# Patient Record
Sex: Male | Born: 1975 | Race: White | Hispanic: No | Smoking: Current every day smoker
Health system: Southern US, Community
[De-identification: ages and names within clinical notes are randomized; demographics above are authoritative.]

## PROBLEM LIST (undated history)

## (undated) DIAGNOSIS — E785 Hyperlipidemia, unspecified: Secondary | ICD-10-CM

## (undated) DIAGNOSIS — S069XAA Unspecified intracranial injury with loss of consciousness status unknown, initial encounter: Secondary | ICD-10-CM

## (undated) DIAGNOSIS — T7840XA Allergy, unspecified, initial encounter: Secondary | ICD-10-CM

## (undated) DIAGNOSIS — M109 Gout, unspecified: Secondary | ICD-10-CM

## (undated) DIAGNOSIS — S069X9A Unspecified intracranial injury with loss of consciousness of unspecified duration, initial encounter: Secondary | ICD-10-CM

## (undated) DIAGNOSIS — M1990 Unspecified osteoarthritis, unspecified site: Secondary | ICD-10-CM

## (undated) DIAGNOSIS — I10 Essential (primary) hypertension: Secondary | ICD-10-CM

## (undated) DIAGNOSIS — R569 Unspecified convulsions: Secondary | ICD-10-CM

## (undated) HISTORY — DX: Gout, unspecified: M10.9

## (undated) HISTORY — DX: Allergy, unspecified, initial encounter: T78.40XA

## (undated) HISTORY — DX: Unspecified osteoarthritis, unspecified site: M19.90

## (undated) HISTORY — DX: Unspecified convulsions: R56.9

## (undated) HISTORY — DX: Hyperlipidemia, unspecified: E78.5

## (undated) HISTORY — DX: Essential (primary) hypertension: I10

---

## 1996-03-06 HISTORY — PX: OTHER SURGICAL HISTORY: SHX169

## 2010-01-07 ENCOUNTER — Emergency Department: Payer: Self-pay | Admitting: Emergency Medicine

## 2011-01-01 ENCOUNTER — Emergency Department: Payer: Self-pay | Admitting: *Deleted

## 2011-03-04 ENCOUNTER — Emergency Department: Payer: Self-pay | Admitting: Emergency Medicine

## 2013-01-20 DIAGNOSIS — S61409A Unspecified open wound of unspecified hand, initial encounter: Secondary | ICD-10-CM | POA: Insufficient documentation

## 2013-08-27 ENCOUNTER — Emergency Department: Payer: Self-pay | Admitting: Emergency Medicine

## 2015-06-28 ENCOUNTER — Ambulatory Visit (INDEPENDENT_AMBULATORY_CARE_PROVIDER_SITE_OTHER): Payer: Self-pay | Admitting: Family Medicine

## 2015-06-28 ENCOUNTER — Encounter: Payer: Self-pay | Admitting: Family Medicine

## 2015-06-28 VITALS — BP 131/83 | HR 69 | Temp 98.2°F | Resp 16 | Ht 72.0 in | Wt 201.4 lb

## 2015-06-28 DIAGNOSIS — M1A0610 Idiopathic chronic gout, right knee, without tophus (tophi): Secondary | ICD-10-CM

## 2015-06-28 DIAGNOSIS — Z720 Tobacco use: Secondary | ICD-10-CM | POA: Insufficient documentation

## 2015-06-28 DIAGNOSIS — Z0000 Encounter for general adult medical examination without abnormal findings: Secondary | ICD-10-CM

## 2015-06-28 DIAGNOSIS — E781 Pure hyperglyceridemia: Secondary | ICD-10-CM

## 2015-06-28 DIAGNOSIS — M109 Gout, unspecified: Secondary | ICD-10-CM | POA: Insufficient documentation

## 2015-06-28 MED ORDER — INDOMETHACIN 50 MG PO CAPS: 50.0000 mg | ORAL_CAPSULE | Freq: Three times a day (TID) | ORAL | Status: DC

## 2015-06-28 NOTE — Progress Notes (Signed)
Subjective:    Patient ID: Chad Brady, male    DOB: 08-30-1975, 40 y.o.   MRN: [id number]  HPI: Chad Brady is a 40 y.o. male presenting on 06/28/2015 for Establish Care   HPI  Pt presents to establish care today and for physical. Previous care provider was NONE.  It has been  years since His last PCP visit. Records from previous provider will be requested and reviewed. Current medical problems include:  Gout: First flare was age 68. Due to alcohol. Last gout flare was last week. Has been on allopurinol in the past. + gout at [facility]. Flares when he doesn't water. Joints: L knee, R wrist HTN: While in prison but is diet controlled.  High cholesterol: Told he had in past, high triglycerides. Take fish oil daily.   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  Prostate cancer: never been tested. No family history.  Incarcerated 08-10- had 2 negative PPDs since prison.  Declines HIV and hepatitis testing. Had negative in past.     Past Medical History  Diagnosis Date  . Allergy   . Arthritis   . Hypertension   . Hyperlipidemia   . Seizure (HCC)   . Gout    Social History   Social History  . Marital Status: Single    Spouse Name: N/A  . Number of Children: N/A  . Years of Education: N/A   Occupational History  . Not on file.   Social History Main Topics  . Smoking status: Current Every Day Smoker -- 1.00 packs/day  . Smokeless tobacco: Not on file  . Alcohol Use: No  . Drug Use: No  . Sexual Activity: Not on file   Other Topics Concern  . Not on file   Social History Narrative  . No narrative on file   Family History  Problem Relation Age of Onset  . Alcohol abuse Father   . Heart disease Father   . Stroke Father   . Diabetes Maternal Grandmother   . Diabetes Maternal Grandfather   . Heart disease Maternal Grandfather    No current outpatient prescriptions on file prior to visit.   No current facility-administered medications  on file prior to visit.    Review of Systems  Constitutional: Negative for fever and chills.  HENT: Negative.   Respiratory: Negative for chest tightness, shortness of breath and wheezing.   Cardiovascular: Negative for chest pain, palpitations and leg swelling.  Gastrointestinal: Negative for nausea, vomiting and abdominal pain.  Endocrine: Negative.   Genitourinary: Negative for dysuria, urgency, discharge, penile pain and testicular pain.  Musculoskeletal: Negative for back pain, joint swelling and arthralgias.  Skin: Negative.   Neurological: Negative for dizziness, weakness, numbness and headaches.  Psychiatric/Behavioral: Negative for sleep disturbance and dysphoric mood.   Per HPI unless specifically indicated above     Objective:    BP 131/83 mmHg  Pulse 69  Temp(Src) 98.2 F (36.8 C) (Oral)  Resp 16  Ht 6' (1.829 m)  Wt 201 lb 6.4 oz (91.354 kg)  BMI 27.31 kg/m2  Wt Readings from Last 3 Encounters:  06/28/15 201 lb 6.4 oz (91.354 kg)    Physical Exam  Constitutional: He is oriented to person, place, and time. He appears well-developed and well-nourished. No distress.  HENT:  Head: Normocephalic and atraumatic.  Neck: Neck supple. No thyromegaly present.  Cardiovascular: Normal rate, regular rhythm and normal heart sounds.  Exam reveals no gallop and  no friction rub.   No murmur heard. Pulmonary/Chest: Effort normal and breath sounds normal. He has no wheezes.  Abdominal: Soft. Bowel sounds are normal. He exhibits no distension. There is no tenderness. There is no rebound.  Musculoskeletal: Normal range of motion. He exhibits no edema or tenderness.  Neurological: He is alert and oriented to person, place, and time. He has normal reflexes.  Skin: Skin is warm and dry. No rash noted. No erythema.  Psychiatric: He has a normal mood and affect. His behavior is normal. Thought content normal.   No results found for this or any previous visit.    Assessment & Plan:     Problem List Items Addressed This Visit      Other   Hypertriglyceridemia - Primary    Takes fish oil daily. Will check baseline lipids today.       Relevant Orders   Lipid Profile   Comprehensive Metabolic Panel (CMET)   Gout    Renwed PRN indomethicin for flares. Check urine acid to determine if suppressive therapy is needed.       Relevant Medications   indomethacin (INDOCIN) 50 MG capsule   Other Relevant Orders   Uric acid   Tobacco use    Encouraged smoking cessation.        Other Visit Diagnoses    Preventative health care        Reviewed health maintenance. Form signed for DSS physical.        Meds ordered this encounter  Medications  . indomethacin (INDOCIN) 50 MG capsule    Sig: Take 1 capsule (50 mg total) by mouth 3 (three) times daily with meals.    Dispense:  30 capsule    Refill:  5    Order Specific Question:  Supervising Provider    Answer:  Janeann ForehandHAWKINS JR, JAMES H [[id number]]      Follow up plan: Return in about 3 months (around 09/28/2015), or if symptoms worsen or fail to improve.

## 2015-06-28 NOTE — Assessment & Plan Note (Signed)
Renwed PRN indomethicin for flares. Check urine acid to determine if suppressive therapy is needed.

## 2015-06-28 NOTE — Patient Instructions (Signed)

## 2015-06-28 NOTE — Assessment & Plan Note (Signed)
Encouraged smoking cessation 

## 2015-06-28 NOTE — Assessment & Plan Note (Signed)
Takes fish oil daily. Will check baseline lipids today.

## 2015-07-16 ENCOUNTER — Other Ambulatory Visit: Payer: No Typology Code available for payment source

## 2015-07-24 ENCOUNTER — Other Ambulatory Visit: Payer: No Typology Code available for payment source

## 2015-10-20 ENCOUNTER — Encounter: Payer: Self-pay | Admitting: Emergency Medicine

## 2015-10-20 ENCOUNTER — Emergency Department
Admission: EM | Admit: 2015-10-20 | Discharge: 2015-10-20 | Disposition: A | Discharge status: Home / Self Care | Payer: Worker's Compensation | Attending: Emergency Medicine | Admitting: Emergency Medicine

## 2015-10-20 DIAGNOSIS — F17200 Nicotine dependence, unspecified, uncomplicated: Secondary | ICD-10-CM | POA: Insufficient documentation

## 2015-10-20 DIAGNOSIS — I10 Essential (primary) hypertension: Secondary | ICD-10-CM | POA: Diagnosis not present

## 2015-10-20 DIAGNOSIS — L03011 Cellulitis of right finger: Secondary | ICD-10-CM | POA: Diagnosis not present

## 2015-10-20 DIAGNOSIS — Z5189 Encounter for other specified aftercare: Secondary | ICD-10-CM

## 2015-10-20 LAB — CBC WITH DIFFERENTIAL/PLATELET
BASOS ABS: 0.1 10*3/uL (ref 0–0.1)
BASOS PCT: 1 %
EOS ABS: 0.2 10*3/uL (ref 0–0.7)
EOS PCT: 1 %
HEMATOCRIT: 41.3 % (ref 40.0–52.0)
Hemoglobin: 14.7 g/dL (ref 13.0–18.0)
Lymphocytes Relative: 15 %
Lymphs Abs: 2.3 10*3/uL (ref 1.0–3.6)
MCH: 30.2 pg (ref 26.0–34.0)
MCHC: 35.5 g/dL (ref 32.0–36.0)
MCV: 85.1 fL (ref 80.0–100.0)
MONO ABS: 1.4 10*3/uL — AB (ref 0.2–1.0)
Monocytes Relative: 9 %
NEUTROS ABS: 11.1 10*3/uL — AB (ref 1.4–6.5)
Neutrophils Relative %: 74 %
PLATELETS: 289 10*3/uL (ref 150–440)
RBC: 4.86 MIL/uL (ref 4.40–5.90)
RDW: 13.3 % (ref 11.5–14.5)
WBC: 15 10*3/uL — ABNORMAL HIGH (ref 3.8–10.6)

## 2015-10-20 LAB — BASIC METABOLIC PANEL
ANION GAP: 8 (ref 5–15)
BUN: 10 mg/dL (ref 6–20)
CALCIUM: 8.9 mg/dL (ref 8.9–10.3)
CO2: 25 mmol/L (ref 22–32)
Chloride: 100 mmol/L — ABNORMAL LOW (ref 101–111)
Creatinine, Ser: 0.87 mg/dL (ref 0.61–1.24)
GFR calc Af Amer: >60 mL/min (ref >60)
GLUCOSE: 183 mg/dL — AB (ref 65–99)
Potassium: 3.8 mmol/L (ref 3.5–5.1)
Sodium: 133 mmol/L — ABNORMAL LOW (ref 135–145)

## 2015-10-20 LAB — URIC ACID: URIC ACID, SERUM: 5.6 mg/dL (ref 4.4–7.6)

## 2015-10-20 MED ORDER — SULFAMETHOXAZOLE-TRIMETHOPRIM 800-160 MG PO TABS: 1.0000 | ORAL_TABLET | Freq: Two times a day (BID) | ORAL | 0 refills | Status: DC

## 2015-10-20 MED ORDER — INDOMETHACIN 50 MG PO CAPS: 50.0000 mg | ORAL_CAPSULE | Freq: Two times a day (BID) | ORAL | 0 refills | Status: DC

## 2015-10-20 NOTE — ED Triage Notes (Addendum)
Pt cut left second digit Tuesday and was sutured at urgent care. Here for "wound check" because he reports they told him to get it rechecked on Saturday. Also c/o gout flare in right hand. Swelling to right hand noted. Suture wound looks well with no redness or drainage.

## 2015-10-20 NOTE — Discharge Instructions (Signed)
Continue previous medication and start Bactrim DS as directed. If unable to contact urgent care follow orthopedics advised to contact on call orthopedics for today's visit.
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
## 2015-10-31 DIAGNOSIS — S60122A Contusion of left index finger with damage to nail, initial encounter: Secondary | ICD-10-CM | POA: Diagnosis not present

## 2015-10-31 DIAGNOSIS — Y999 Unspecified external cause status: Secondary | ICD-10-CM | POA: Insufficient documentation

## 2015-10-31 DIAGNOSIS — Y929 Unspecified place or not applicable: Secondary | ICD-10-CM | POA: Insufficient documentation

## 2015-10-31 DIAGNOSIS — F17200 Nicotine dependence, unspecified, uncomplicated: Secondary | ICD-10-CM | POA: Insufficient documentation

## 2015-10-31 DIAGNOSIS — X58XXXA Exposure to other specified factors, initial encounter: Secondary | ICD-10-CM | POA: Insufficient documentation

## 2015-10-31 DIAGNOSIS — I10 Essential (primary) hypertension: Secondary | ICD-10-CM | POA: Insufficient documentation

## 2015-10-31 DIAGNOSIS — S6992XA Unspecified injury of left wrist, hand and finger(s), initial encounter: Secondary | ICD-10-CM | POA: Diagnosis present

## 2015-10-31 DIAGNOSIS — Y939 Activity, unspecified: Secondary | ICD-10-CM | POA: Insufficient documentation

## 2015-10-31 NOTE — ED Triage Notes (Signed)
Pt had a lac to left 2nd finger at work had it sutured by [facility] and antibiotics started. Had antibiotics added due to infection and referred to hand surgeon due to open fx. Had follow up Saturday at [facility] told to continue antibiotics, here for worsening infection.

## 2015-11-01 ENCOUNTER — Emergency Department
Admission: EM | Admit: 2015-11-01 | Discharge: 2015-11-01 | Disposition: A | Discharge status: Home / Self Care | Payer: Worker's Compensation | Attending: Emergency Medicine | Admitting: Emergency Medicine

## 2015-11-01 ENCOUNTER — Emergency Department: Payer: Worker's Compensation

## 2015-11-01 DIAGNOSIS — B999 Unspecified infectious disease: Secondary | ICD-10-CM

## 2015-11-01 DIAGNOSIS — S6992XA Unspecified injury of left wrist, hand and finger(s), initial encounter: Secondary | ICD-10-CM

## 2015-11-01 NOTE — Discharge Instructions (Signed)
At this time, we would like to stress the absolute need to follow instructions and follow up closely with orthopedic surgeon in the next day or 2. As indicated, you have had a broken finger and apparently did have an infection. Wear the splint . If you have increased pain, redness, streaks from the area, trouble bending her finger aside from that which he already have, pain in your hand or other worrisome symptoms return to the emergency room. Your symptoms require the attention of an orthopedic surgeon. Urgent care and emergency Department are not going to be sufficient to take care of this mild we are certainly happy to see you and help you in any way we can, we stressed the need to follow closely with orthopedic surgery. Failure to do so could result in significant limitation or even loss of the finger.
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
## 2015-11-01 NOTE — ED Notes (Signed)
Pt. Verbalizes concern of odor from infected injured finger that persists even with oral antibiotics.

## 2015-11-01 NOTE — ED Notes (Signed)
Pt. States friend taking him home.

## 2015-11-01 NOTE — ED Notes (Signed)
Patient is resting comfortably at this time with no signs of distress present. Will continue to monitor.   

## 2017-08-01 IMAGING — DX DG FINGER INDEX 2+V*L*
3 series · 3 of 3 positions shown · non-contrast
Comparison: None.

CLINICAL DATA: Laceration to the left index finger, with worsening
infection. Initial encounter.

EXAM:
LEFT INDEX FINGER 2+V

[finger ap]
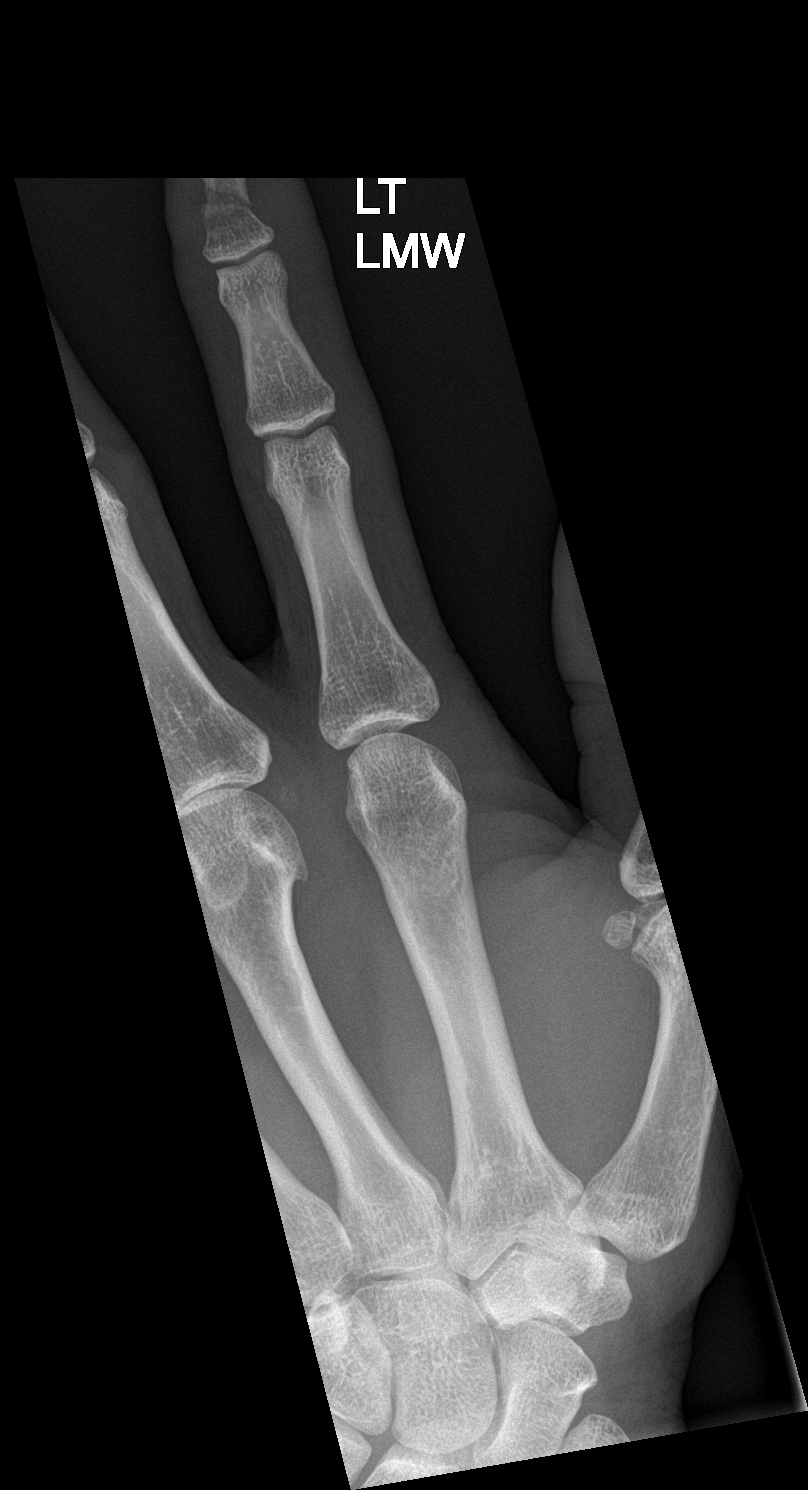

[finger obl]
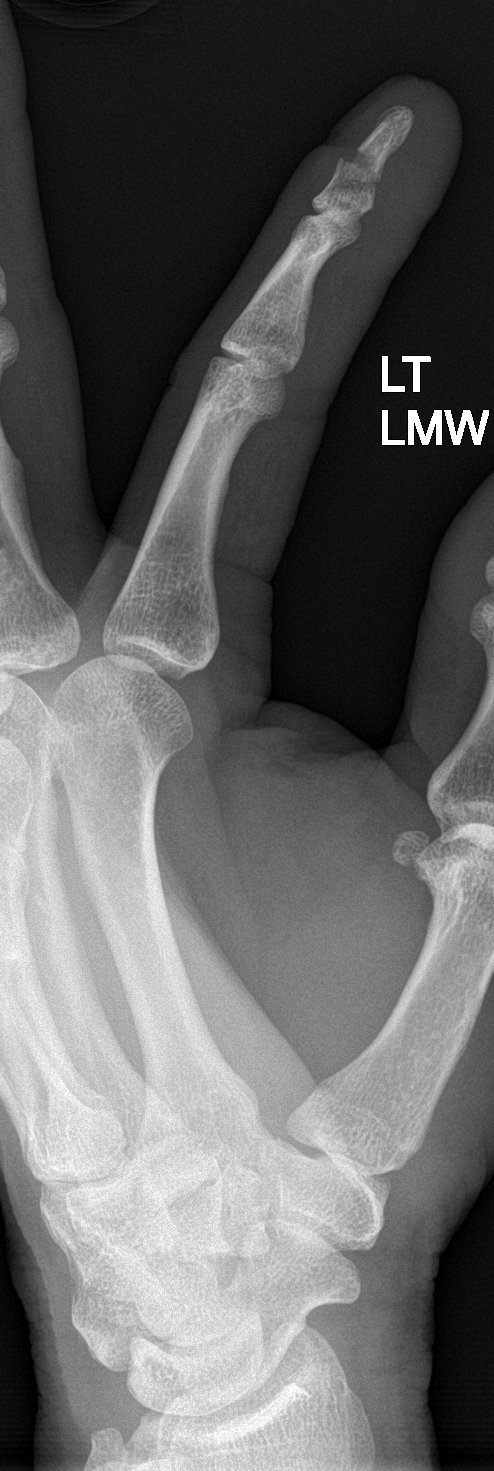

[finger lat]
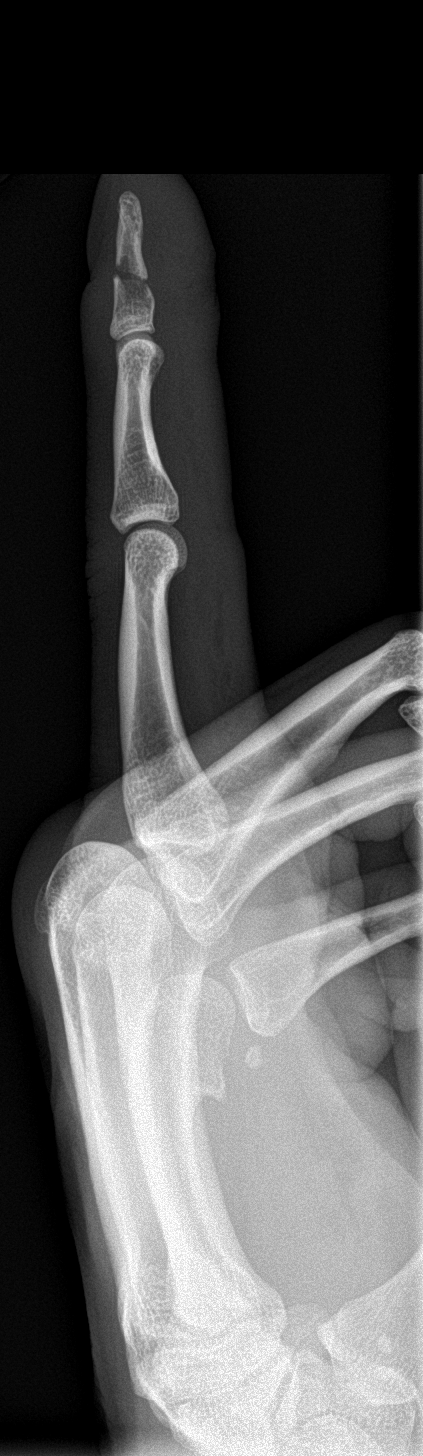

[3 of 3 positions shown; findings below may reference images not displayed]

FINDINGS: There is a mildly displaced fracture through the second distal
phalanx, without evidence of intra-articular extension. There is
vague lucency at the fracture site, both proximally and distally,
and given symptoms of infection, underlying osteomyelitis cannot be
excluded. Mild soft tissue swelling is noted about the distal second
digit.
IMPRESSION: Mildly displaced fracture through the second distal phalanx, without
evidence of intra-articular extension. Vague lucency at the fracture
site, both proximally and distally. Given symptoms of infection,
underlying osteomyelitis cannot be excluded. Depending on the degree
of clinical concern, MRI could be considered for further evaluation.

## 2017-11-22 ENCOUNTER — Other Ambulatory Visit: Payer: Self-pay

## 2017-11-22 ENCOUNTER — Emergency Department
Admission: EM | Admit: 2017-11-22 | Discharge: 2017-11-22 | Disposition: A | Discharge status: Home / Self Care | Payer: Medicaid Other | Attending: Emergency Medicine | Admitting: Emergency Medicine

## 2017-11-22 ENCOUNTER — Emergency Department: Payer: Medicaid Other

## 2017-11-22 DIAGNOSIS — I10 Essential (primary) hypertension: Secondary | ICD-10-CM | POA: Insufficient documentation

## 2017-11-22 DIAGNOSIS — F17200 Nicotine dependence, unspecified, uncomplicated: Secondary | ICD-10-CM | POA: Insufficient documentation

## 2017-11-22 DIAGNOSIS — M25511 Pain in right shoulder: Secondary | ICD-10-CM | POA: Insufficient documentation

## 2017-11-22 DIAGNOSIS — M25512 Pain in left shoulder: Secondary | ICD-10-CM | POA: Insufficient documentation

## 2017-11-22 DIAGNOSIS — Y9389 Activity, other specified: Secondary | ICD-10-CM | POA: Insufficient documentation

## 2017-11-22 DIAGNOSIS — W010XXA Fall on same level from slipping, tripping and stumbling without subsequent striking against object, initial encounter: Secondary | ICD-10-CM | POA: Insufficient documentation

## 2017-11-22 DIAGNOSIS — Y999 Unspecified external cause status: Secondary | ICD-10-CM | POA: Insufficient documentation

## 2017-11-22 DIAGNOSIS — Z79899 Other long term (current) drug therapy: Secondary | ICD-10-CM | POA: Insufficient documentation

## 2017-11-22 DIAGNOSIS — Y929 Unspecified place or not applicable: Secondary | ICD-10-CM | POA: Insufficient documentation

## 2017-11-22 MED ORDER — HYDROCODONE-ACETAMINOPHEN 5-325 MG PO TABS
1.0000 | ORAL_TABLET | Freq: Four times a day (QID) | ORAL | 0 refills | Status: AC | PRN
Start: 2017-11-22 — End: 2017-11-25

## 2017-11-22 MED ORDER — MELOXICAM 15 MG PO TABS: 15.0000 mg | ORAL_TABLET | Freq: Every day | ORAL | 0 refills | Status: AC
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
## 2018-10-01 ENCOUNTER — Encounter: Payer: Self-pay | Admitting: Emergency Medicine

## 2018-10-01 ENCOUNTER — Other Ambulatory Visit: Payer: Self-pay

## 2018-10-01 ENCOUNTER — Emergency Department
Admission: EM | Admit: 2018-10-01 | Discharge: 2018-10-01 | Disposition: A | Discharge status: Home / Self Care | Payer: Medicaid Other | Attending: Emergency Medicine | Admitting: Emergency Medicine

## 2018-10-01 ENCOUNTER — Emergency Department: Payer: Medicaid Other

## 2018-10-01 DIAGNOSIS — Y92017 Garden or yard in single-family (private) house as the place of occurrence of the external cause: Secondary | ICD-10-CM | POA: Diagnosis not present

## 2018-10-01 DIAGNOSIS — Z79899 Other long term (current) drug therapy: Secondary | ICD-10-CM | POA: Diagnosis not present

## 2018-10-01 DIAGNOSIS — F17210 Nicotine dependence, cigarettes, uncomplicated: Secondary | ICD-10-CM | POA: Insufficient documentation

## 2018-10-01 DIAGNOSIS — W01198A Fall on same level from slipping, tripping and stumbling with subsequent striking against other object, initial encounter: Secondary | ICD-10-CM | POA: Insufficient documentation

## 2018-10-01 DIAGNOSIS — I10 Essential (primary) hypertension: Secondary | ICD-10-CM | POA: Insufficient documentation

## 2018-10-01 DIAGNOSIS — S2242XA Multiple fractures of ribs, left side, initial encounter for closed fracture: Secondary | ICD-10-CM | POA: Insufficient documentation

## 2018-10-01 DIAGNOSIS — S299XXA Unspecified injury of thorax, initial encounter: Secondary | ICD-10-CM | POA: Diagnosis present

## 2018-10-01 DIAGNOSIS — Y999 Unspecified external cause status: Secondary | ICD-10-CM | POA: Diagnosis not present

## 2018-10-01 DIAGNOSIS — Y9389 Activity, other specified: Secondary | ICD-10-CM | POA: Diagnosis not present

## 2018-10-01 MED ORDER — MELOXICAM 15 MG PO TABS: 15.0000 mg | ORAL_TABLET | Freq: Every day | ORAL | 0 refills | Status: DC

## 2018-10-01 MED ORDER — OXYCODONE-ACETAMINOPHEN 5-325 MG PO TABS
1.0000 | ORAL_TABLET | Freq: Once | ORAL | Status: AC
  Administered 2018-10-01: 1 via ORAL
  Filled 2018-10-01: qty 1

## 2018-10-01 MED ORDER — OXYCODONE-ACETAMINOPHEN 5-325 MG PO TABS: 1.0000 | ORAL_TABLET | Freq: Four times a day (QID) | ORAL | 0 refills | Status: DC | PRN

## 2018-10-01 MED ORDER — MELOXICAM 7.5 MG PO TABS
15.0000 mg | ORAL_TABLET | Freq: Once | ORAL | Status: AC
  Administered 2018-10-01: 15 mg via ORAL
  Filled 2018-10-01: qty 2
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
## 2018-10-01 NOTE — ED Notes (Signed)
Pt verbalized understanding of discharge instructions. NAD at this time. 

## 2018-10-01 NOTE — ED Notes (Signed)
Pt had fall few days ago playing with son; pt's chest hit wheelbarrow and pt heard pop noise at L chest; states hard to take deep breath.

## 2019-12-05 ENCOUNTER — Encounter: Payer: Self-pay | Admitting: Emergency Medicine

## 2019-12-05 ENCOUNTER — Other Ambulatory Visit: Payer: Self-pay

## 2019-12-05 ENCOUNTER — Emergency Department
Admission: EM | Admit: 2019-12-05 | Discharge: 2019-12-05 | Disposition: A | Discharge status: Home / Self Care | Payer: Medicaid Other | Attending: Emergency Medicine | Admitting: Emergency Medicine

## 2019-12-05 ENCOUNTER — Emergency Department: Payer: Medicaid Other

## 2019-12-05 DIAGNOSIS — I10 Essential (primary) hypertension: Secondary | ICD-10-CM | POA: Insufficient documentation

## 2019-12-05 DIAGNOSIS — W231XXA Caught, crushed, jammed, or pinched between stationary objects, initial encounter: Secondary | ICD-10-CM | POA: Insufficient documentation

## 2019-12-05 DIAGNOSIS — F17200 Nicotine dependence, unspecified, uncomplicated: Secondary | ICD-10-CM | POA: Insufficient documentation

## 2019-12-05 DIAGNOSIS — S62660B Nondisplaced fracture of distal phalanx of right index finger, initial encounter for open fracture: Secondary | ICD-10-CM | POA: Insufficient documentation

## 2019-12-05 DIAGNOSIS — Z23 Encounter for immunization: Secondary | ICD-10-CM | POA: Insufficient documentation

## 2019-12-05 DIAGNOSIS — Y92810 Car as the place of occurrence of the external cause: Secondary | ICD-10-CM | POA: Diagnosis not present

## 2019-12-05 DIAGNOSIS — S6991XA Unspecified injury of right wrist, hand and finger(s), initial encounter: Secondary | ICD-10-CM | POA: Diagnosis present

## 2019-12-05 DIAGNOSIS — S61209A Unspecified open wound of unspecified finger without damage to nail, initial encounter: Secondary | ICD-10-CM

## 2019-12-05 HISTORY — DX: Unspecified intracranial injury with loss of consciousness of unspecified duration, initial encounter: S06.9X9A

## 2019-12-05 HISTORY — DX: Unspecified intracranial injury with loss of consciousness status unknown, initial encounter: S06.9XAA

## 2019-12-05 MED ORDER — CEPHALEXIN 500 MG PO CAPS: 500.0000 mg | ORAL_CAPSULE | Freq: Four times a day (QID) | ORAL | 0 refills | Status: AC

## 2019-12-05 MED ORDER — TETANUS-DIPHTH-ACELL PERTUSSIS 5-2.5-18.5 LF-MCG/0.5 IM SUSY
0.5000 mL | PREFILLED_SYRINGE | Freq: Once | INTRAMUSCULAR | Status: AC
  Administered 2019-12-05: 0.5 mL via INTRAMUSCULAR
  Filled 2019-12-05: qty 0.5

## 2019-12-05 MED ORDER — OXYCODONE-ACETAMINOPHEN 5-325 MG PO TABS
1.0000 | ORAL_TABLET | Freq: Once | ORAL | Status: AC
  Administered 2019-12-05: 1 via ORAL
  Filled 2019-12-05: qty 1

## 2019-12-05 MED ORDER — CEPHALEXIN 500 MG PO CAPS
500.0000 mg | ORAL_CAPSULE | Freq: Once | ORAL | Status: AC
  Administered 2019-12-05: 500 mg via ORAL
  Filled 2019-12-05: qty 1
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
## 2019-12-05 NOTE — Discharge Instructions (Addendum)
You have a fracture in your finger as well as a finger tip avusion. Please keep wound covered at all times. Change gauze dressing daily and reapply splint. Please take antibiotics to help to prevent an infection. Please call orthopedics in the morning for a follow up appointment this week. Return to the emergency department for any signs of infection or any symptoms concerning to you.

## 2020-07-01 ENCOUNTER — Emergency Department (HOSPITAL_COMMUNITY)
Admission: EM | Admit: 2020-07-01 | Discharge: 2020-07-01 | Disposition: A | Discharge status: Home / Self Care | Payer: Medicaid Other | Attending: Emergency Medicine | Admitting: Emergency Medicine

## 2020-07-01 ENCOUNTER — Emergency Department (HOSPITAL_COMMUNITY): Payer: Medicaid Other

## 2020-07-01 ENCOUNTER — Other Ambulatory Visit: Payer: Self-pay

## 2020-07-01 ENCOUNTER — Encounter (HOSPITAL_COMMUNITY): Payer: Self-pay

## 2020-07-01 DIAGNOSIS — T07XXXA Unspecified multiple injuries, initial encounter: Secondary | ICD-10-CM

## 2020-07-01 DIAGNOSIS — T1490XA Injury, unspecified, initial encounter: Secondary | ICD-10-CM

## 2020-07-01 DIAGNOSIS — Y907 Blood alcohol level of 200-239 mg/100 ml: Secondary | ICD-10-CM | POA: Diagnosis not present

## 2020-07-01 DIAGNOSIS — S0081XA Abrasion of other part of head, initial encounter: Secondary | ICD-10-CM | POA: Diagnosis not present

## 2020-07-01 DIAGNOSIS — S0990XA Unspecified injury of head, initial encounter: Secondary | ICD-10-CM | POA: Diagnosis present

## 2020-07-01 DIAGNOSIS — Y92410 Unspecified street and highway as the place of occurrence of the external cause: Secondary | ICD-10-CM | POA: Diagnosis not present

## 2020-07-01 LAB — CBC
HCT: 48.4 % (ref 39.0–52.0)
Hemoglobin: 16.5 g/dL (ref 13.0–17.0)
MCH: 33.2 pg (ref 26.0–34.0)
MCHC: 34.1 g/dL (ref 30.0–36.0)
MCV: 97.4 fL (ref 80.0–100.0)
Platelets: 236 10*3/uL (ref 150–400)
RBC: 4.97 MIL/uL (ref 4.22–5.81)
RDW: 12.7 % (ref 11.5–15.5)
WBC: 9.9 10*3/uL (ref 4.0–10.5)
nRBC: 0 % (ref 0.0–0.2)

## 2020-07-01 LAB — COMPREHENSIVE METABOLIC PANEL
ALT: 25 U/L (ref 0–44)
AST: 37 U/L (ref 15–41)
Albumin: 3.9 g/dL (ref 3.5–5.0)
Alkaline Phosphatase: 94 U/L (ref 38–126)
Anion gap: 8 (ref 5–15)
BUN: 7 mg/dL (ref 6–20)
CO2: 26 mmol/L (ref 22–32)
Calcium: 8.6 mg/dL — ABNORMAL LOW (ref 8.9–10.3)
Chloride: 106 mmol/L (ref 98–111)
Creatinine, Ser: 0.65 mg/dL (ref 0.61–1.24)
GFR, Estimated: >60 mL/min (ref >60)
Glucose, Bld: 136 mg/dL — ABNORMAL HIGH (ref 70–99)
Potassium: 3.6 mmol/L (ref 3.5–5.1)
Sodium: 140 mmol/L (ref 135–145)
Total Bilirubin: 1 mg/dL (ref 0.3–1.2)
Total Protein: 6.7 g/dL (ref 6.5–8.1)

## 2020-07-01 LAB — LACTIC ACID, PLASMA: Lactic Acid, Venous: 1.9 mmol/L (ref 0.5–1.9)

## 2020-07-01 LAB — PROTIME-INR
INR: 1 (ref 0.8–1.2)
Prothrombin Time: 13 seconds (ref 11.4–15.2)

## 2020-07-01 LAB — ETHANOL: Alcohol, Ethyl (B): 222 mg/dL — ABNORMAL HIGH (ref <10)

## 2020-07-01 MED ORDER — SODIUM CHLORIDE 0.9 % IV BOLUS
1000.0000 mL | Freq: Once | INTRAVENOUS | Status: AC
  Administered 2020-07-01: 1000 mL via INTRAVENOUS

## 2020-07-01 MED ORDER — IOHEXOL 300 MG/ML  SOLN
75.0000 mL | Freq: Once | INTRAMUSCULAR | Status: AC | PRN
  Administered 2020-07-01: 75 mL via INTRAVENOUS

## 2020-07-01 MED ORDER — SODIUM CHLORIDE 0.9 % IV SOLN: INTRAVENOUS | Status: DC
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
## 2020-07-01 NOTE — ED Notes (Signed)
Patient given paper scrubs, and socks to change into.

## 2020-07-02 LAB — SAMPLE TO BLOOD BANK

## 2020-07-03 ENCOUNTER — Encounter: Payer: Self-pay | Admitting: Emergency Medicine

## 2021-06-17 ENCOUNTER — Other Ambulatory Visit: Payer: Self-pay

## 2021-06-17 ENCOUNTER — Emergency Department
Admission: EM | Admit: 2021-06-17 | Discharge: 2021-06-18 | Disposition: A | Discharge status: Home / Self Care | Payer: Medicaid Other | Attending: Emergency Medicine | Admitting: Emergency Medicine

## 2021-06-17 ENCOUNTER — Emergency Department: Payer: Medicaid Other

## 2021-06-17 DIAGNOSIS — Z79899 Other long term (current) drug therapy: Secondary | ICD-10-CM | POA: Insufficient documentation

## 2021-06-17 DIAGNOSIS — L03115 Cellulitis of right lower limb: Secondary | ICD-10-CM | POA: Insufficient documentation

## 2021-06-17 DIAGNOSIS — I10 Essential (primary) hypertension: Secondary | ICD-10-CM | POA: Insufficient documentation

## 2021-06-17 DIAGNOSIS — M25561 Pain in right knee: Secondary | ICD-10-CM | POA: Diagnosis present

## 2021-06-17 NOTE — ED Notes (Signed)
Reports PMH HTN. denies taking meds. Denies chest pain, headache, dizziness, or SOB.  ?

## 2021-06-17 NOTE — ED Triage Notes (Signed)
Pt presents via POV c/o swelling, pain, and drainage to right knee. Purulent drainage noted. Reports symptoms x8 days. Denies fevers at home.  ?

## 2021-06-18 MED ORDER — CEPHALEXIN 500 MG PO CAPS: 500.0000 mg | ORAL_CAPSULE | Freq: Three times a day (TID) | ORAL | 0 refills | Status: DC

## 2021-06-18 MED ORDER — SULFAMETHOXAZOLE-TRIMETHOPRIM 800-160 MG PO TABS: 2.0000 | ORAL_TABLET | Freq: Two times a day (BID) | ORAL | 0 refills | Status: DC

## 2021-06-18 MED ORDER — CEPHALEXIN 500 MG PO CAPS
500.0000 mg | ORAL_CAPSULE | Freq: Once | ORAL | Status: AC
  Administered 2021-06-18: 500 mg via ORAL
  Filled 2021-06-18: qty 1

## 2021-06-18 MED ORDER — SULFAMETHOXAZOLE-TRIMETHOPRIM 800-160 MG PO TABS
2.0000 | ORAL_TABLET | Freq: Once | ORAL | Status: AC
  Administered 2021-06-18: 2 via ORAL
  Filled 2021-06-18: qty 2

## 2021-06-18 MED ORDER — AMLODIPINE BESYLATE 5 MG PO TABS: 5.0000 mg | ORAL_TABLET | Freq: Every day | ORAL | 0 refills | Status: DC

## 2021-06-18 NOTE — Discharge Instructions (Signed)
1.  Start Amlodipine 5 mg daily for your blood pressure. ?2.  Take these antibiotics daily: ?Keflex 500 mg 3 times daily x7 days ?Bactrim DS 2 tablets twice daily x7 days ?3.  Return to the ER for worsening symptoms, persistent vomiting, fever or other concerns. ?
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
## 2021-06-18 NOTE — ED Notes (Signed)
Pt discharge information reviewed. Pt understands need for follow up care and when to return if symptoms worsen. Medications discussed.  All questions answered. Pt is alert and oriented with even and regular respirations. Pt is seen ambulating out of department with string steady gait.   

## 2021-06-18 NOTE — ED Notes (Signed)
Dr. Dolores Frame aware of pts discharge blood pressure and ok with it for discharge. ?

## 2022-04-28 ENCOUNTER — Emergency Department: Payer: Medicaid Other

## 2022-04-28 ENCOUNTER — Other Ambulatory Visit: Payer: Self-pay

## 2022-04-28 ENCOUNTER — Inpatient Hospital Stay: Payer: Medicaid Other

## 2022-04-28 ENCOUNTER — Inpatient Hospital Stay
Admission: EM | Admit: 2022-04-28 | Discharge: 2022-05-03 | DRG: 897 | Disposition: A | Discharge status: Home / Self Care | Payer: Medicaid Other | Attending: Hospitalist | Admitting: Hospitalist

## 2022-04-28 DIAGNOSIS — F10939 Alcohol use, unspecified with withdrawal, unspecified: Secondary | ICD-10-CM | POA: Diagnosis present

## 2022-04-28 DIAGNOSIS — Z833 Family history of diabetes mellitus: Secondary | ICD-10-CM | POA: Diagnosis not present

## 2022-04-28 DIAGNOSIS — E785 Hyperlipidemia, unspecified: Secondary | ICD-10-CM | POA: Diagnosis present

## 2022-04-28 DIAGNOSIS — K760 Fatty (change of) liver, not elsewhere classified: Secondary | ICD-10-CM | POA: Diagnosis present

## 2022-04-28 DIAGNOSIS — R2681 Unsteadiness on feet: Secondary | ICD-10-CM | POA: Diagnosis present

## 2022-04-28 DIAGNOSIS — F10932 Alcohol use, unspecified with withdrawal with perceptual disturbance: Principal | ICD-10-CM

## 2022-04-28 DIAGNOSIS — M1990 Unspecified osteoarthritis, unspecified site: Secondary | ICD-10-CM | POA: Diagnosis present

## 2022-04-28 DIAGNOSIS — Z79899 Other long term (current) drug therapy: Secondary | ICD-10-CM

## 2022-04-28 DIAGNOSIS — Z87820 Personal history of traumatic brain injury: Secondary | ICD-10-CM

## 2022-04-28 DIAGNOSIS — W19XXXA Unspecified fall, initial encounter: Secondary | ICD-10-CM

## 2022-04-28 DIAGNOSIS — I10 Essential (primary) hypertension: Secondary | ICD-10-CM | POA: Diagnosis present

## 2022-04-28 DIAGNOSIS — S0083XA Contusion of other part of head, initial encounter: Secondary | ICD-10-CM | POA: Diagnosis present

## 2022-04-28 DIAGNOSIS — F1090 Alcohol use, unspecified, uncomplicated: Secondary | ICD-10-CM | POA: Insufficient documentation

## 2022-04-28 DIAGNOSIS — R440 Auditory hallucinations: Secondary | ICD-10-CM | POA: Diagnosis present

## 2022-04-28 DIAGNOSIS — F419 Anxiety disorder, unspecified: Secondary | ICD-10-CM | POA: Diagnosis present

## 2022-04-28 DIAGNOSIS — Z791 Long term (current) use of non-steroidal anti-inflammatories (NSAID): Secondary | ICD-10-CM

## 2022-04-28 DIAGNOSIS — Z823 Family history of stroke: Secondary | ICD-10-CM

## 2022-04-28 DIAGNOSIS — E876 Hypokalemia: Secondary | ICD-10-CM | POA: Diagnosis present

## 2022-04-28 DIAGNOSIS — Z8249 Family history of ischemic heart disease and other diseases of the circulatory system: Secondary | ICD-10-CM | POA: Diagnosis not present

## 2022-04-28 DIAGNOSIS — F10232 Alcohol dependence with withdrawal with perceptual disturbance: Secondary | ICD-10-CM | POA: Diagnosis present

## 2022-04-28 DIAGNOSIS — Y92009 Unspecified place in unspecified non-institutional (private) residence as the place of occurrence of the external cause: Secondary | ICD-10-CM

## 2022-04-28 DIAGNOSIS — M109 Gout, unspecified: Secondary | ICD-10-CM | POA: Diagnosis present

## 2022-04-28 DIAGNOSIS — F1290 Cannabis use, unspecified, uncomplicated: Secondary | ICD-10-CM | POA: Diagnosis present

## 2022-04-28 DIAGNOSIS — Z811 Family history of alcohol abuse and dependence: Secondary | ICD-10-CM | POA: Diagnosis not present

## 2022-04-28 DIAGNOSIS — R1110 Vomiting, unspecified: Secondary | ICD-10-CM

## 2022-04-28 DIAGNOSIS — R7303 Prediabetes: Secondary | ICD-10-CM | POA: Diagnosis present

## 2022-04-28 DIAGNOSIS — W109XXA Fall (on) (from) unspecified stairs and steps, initial encounter: Secondary | ICD-10-CM | POA: Diagnosis present

## 2022-04-28 DIAGNOSIS — F17210 Nicotine dependence, cigarettes, uncomplicated: Secondary | ICD-10-CM | POA: Diagnosis present

## 2022-04-28 DIAGNOSIS — R739 Hyperglycemia, unspecified: Secondary | ICD-10-CM

## 2022-04-28 DIAGNOSIS — S0990XA Unspecified injury of head, initial encounter: Secondary | ICD-10-CM

## 2022-04-28 DIAGNOSIS — K759 Inflammatory liver disease, unspecified: Secondary | ICD-10-CM | POA: Insufficient documentation

## 2022-04-28 DIAGNOSIS — K7010 Alcoholic hepatitis without ascites: Secondary | ICD-10-CM | POA: Diagnosis present

## 2022-04-28 DIAGNOSIS — F1010 Alcohol abuse, uncomplicated: Secondary | ICD-10-CM | POA: Insufficient documentation

## 2022-04-28 LAB — COMPREHENSIVE METABOLIC PANEL
ALT: 58 U/L — ABNORMAL HIGH (ref 0–44)
AST: 66 U/L — ABNORMAL HIGH (ref 15–41)
Albumin: 4.6 g/dL (ref 3.5–5.0)
Alkaline Phosphatase: 64 U/L (ref 38–126)
Anion gap: 17 — ABNORMAL HIGH (ref 5–15)
BUN: 20 mg/dL (ref 6–20)
CO2: 27 mmol/L (ref 22–32)
Calcium: 9 mg/dL (ref 8.9–10.3)
Chloride: 88 mmol/L — ABNORMAL LOW (ref 98–111)
Creatinine, Ser: 1.1 mg/dL (ref 0.61–1.24)
GFR, Estimated: >60 mL/min (ref >60)
Glucose, Bld: 184 mg/dL — ABNORMAL HIGH (ref 70–99)
Potassium: 3.3 mmol/L — ABNORMAL LOW (ref 3.5–5.1)
Sodium: 132 mmol/L — ABNORMAL LOW (ref 135–145)
Total Bilirubin: 2.4 mg/dL — ABNORMAL HIGH (ref 0.3–1.2)
Total Protein: 8.3 g/dL — ABNORMAL HIGH (ref 6.5–8.1)

## 2022-04-28 LAB — CBC
HCT: 48.9 % (ref 39.0–52.0)
Hemoglobin: 17.2 g/dL — ABNORMAL HIGH (ref 13.0–17.0)
MCH: 32.2 pg (ref 26.0–34.0)
MCHC: 35.2 g/dL (ref 30.0–36.0)
MCV: 91.6 fL (ref 80.0–100.0)
Platelets: 265 10*3/uL (ref 150–400)
RBC: 5.34 MIL/uL (ref 4.22–5.81)
RDW: 11.9 % (ref 11.5–15.5)
WBC: 12.2 10*3/uL — ABNORMAL HIGH (ref 4.0–10.5)
nRBC: 0 % (ref 0.0–0.2)

## 2022-04-28 LAB — URINALYSIS, ROUTINE W REFLEX MICROSCOPIC
Bacteria, UA: NONE SEEN
Bilirubin Urine: NEGATIVE
Glucose, UA: NEGATIVE mg/dL
Ketones, ur: NEGATIVE mg/dL
Leukocytes,Ua: NEGATIVE
Nitrite: NEGATIVE
Protein, ur: >=300 mg/dL — AB
Specific Gravity, Urine: 1.028 (ref 1.005–1.030)
pH: 5 (ref 5.0–8.0)

## 2022-04-28 LAB — CBG MONITORING, ED: Glucose-Capillary: 138 mg/dL — ABNORMAL HIGH (ref 70–99)

## 2022-04-28 LAB — URINE DRUG SCREEN, QUALITATIVE (ARMC ONLY)
Amphetamines, Ur Screen: NOT DETECTED
Barbiturates, Ur Screen: NOT DETECTED
Benzodiazepine, Ur Scrn: NOT DETECTED
Cannabinoid 50 Ng, Ur ~~LOC~~: POSITIVE — AB
Cocaine Metabolite,Ur ~~LOC~~: NOT DETECTED
MDMA (Ecstasy)Ur Screen: NOT DETECTED
Methadone Scn, Ur: NOT DETECTED
Opiate, Ur Screen: NOT DETECTED
Phencyclidine (PCP) Ur S: NOT DETECTED
Tricyclic, Ur Screen: NOT DETECTED

## 2022-04-28 LAB — ETHANOL: Alcohol, Ethyl (B): <10 mg/dL (ref <10)

## 2022-04-28 LAB — PROTIME-INR
INR: 1.1 (ref 0.8–1.2)
Prothrombin Time: 14.5 seconds (ref 11.4–15.2)

## 2022-04-28 LAB — APTT: aPTT: 28 seconds (ref 24–36)

## 2022-04-28 LAB — CK: Total CK: 264 U/L (ref 49–397)

## 2022-04-28 MED ORDER — POTASSIUM CHLORIDE 20 MEQ PO PACK
20.0000 meq | PACK | Freq: Once | ORAL | Status: AC
  Administered 2022-04-28: 20 meq via ORAL
  Filled 2022-04-28: qty 1

## 2022-04-28 MED ORDER — DEXTROSE IN LACTATED RINGERS 5 % IV SOLN: INTRAVENOUS | Status: AC

## 2022-04-28 MED ORDER — LORAZEPAM 1 MG PO TABS: 1.0000 mg | ORAL_TABLET | ORAL | Status: DC | PRN

## 2022-04-28 MED ORDER — ADULT MULTIVITAMIN W/MINERALS CH
1.0000 | ORAL_TABLET | Freq: Every day | ORAL | Status: DC
  Administered 2022-04-28 – 2022-05-02 (×5): 1 via ORAL
  Filled 2022-04-28 (×5): qty 1

## 2022-04-28 MED ORDER — THIAMINE HCL 100 MG/ML IJ SOLN
100.0000 mg | Freq: Every day | INTRAMUSCULAR | Status: DC
  Administered 2022-04-28: 100 mg via INTRAVENOUS
  Filled 2022-04-28 (×2): qty 2

## 2022-04-28 MED ORDER — LORAZEPAM 2 MG/ML IJ SOLN: 2.0000 mg | INTRAMUSCULAR | Status: DC | PRN

## 2022-04-28 MED ORDER — LABETALOL HCL 5 MG/ML IV SOLN
20.0000 mg | INTRAVENOUS | Status: DC | PRN
  Administered 2022-04-29: 20 mg via INTRAVENOUS
  Filled 2022-04-28: qty 4

## 2022-04-28 MED ORDER — FOLIC ACID 1 MG PO TABS
1.0000 mg | ORAL_TABLET | Freq: Every day | ORAL | Status: DC
  Administered 2022-04-28 – 2022-05-02 (×5): 1 mg via ORAL
  Filled 2022-04-28 (×5): qty 1

## 2022-04-28 MED ORDER — ONDANSETRON HCL 4 MG/2ML IJ SOLN: 4.0000 mg | Freq: Once | INTRAMUSCULAR | Status: DC

## 2022-04-28 MED ORDER — LORAZEPAM 2 MG/ML IJ SOLN
1.0000 mg | INTRAMUSCULAR | Status: DC | PRN
  Administered 2022-04-28: 4 mg via INTRAVENOUS
  Administered 2022-04-29 (×2): 2 mg via INTRAVENOUS
  Administered 2022-04-30: 4 mg via INTRAVENOUS
  Administered 2022-04-30: 2 mg via INTRAVENOUS
  Administered 2022-04-30: 4 mg via INTRAVENOUS
  Filled 2022-04-28 (×2): qty 1
  Filled 2022-04-28 (×2): qty 2
  Filled 2022-04-28: qty 1
  Filled 2022-04-28: qty 2

## 2022-04-28 MED ORDER — THIAMINE MONONITRATE 100 MG PO TABS
100.0000 mg | ORAL_TABLET | Freq: Every day | ORAL | Status: DC
  Administered 2022-04-29 – 2022-05-02 (×4): 100 mg via ORAL
  Filled 2022-04-28 (×5): qty 1

## 2022-04-28 MED ORDER — INSULIN ASPART 100 UNIT/ML IJ SOLN: 0.0000 [IU] | Freq: Every day | INTRAMUSCULAR | Status: DC

## 2022-04-28 MED ORDER — INSULIN ASPART 100 UNIT/ML IJ SOLN
0.0000 [IU] | Freq: Three times a day (TID) | INTRAMUSCULAR | Status: DC
  Administered 2022-04-29: 1 [IU] via SUBCUTANEOUS
  Administered 2022-04-29: 2 [IU] via SUBCUTANEOUS
  Administered 2022-04-29 – 2022-04-30 (×2): 1 [IU] via SUBCUTANEOUS
  Administered 2022-04-30: 3 [IU] via SUBCUTANEOUS
  Filled 2022-04-28 (×5): qty 1

## 2022-04-28 NOTE — Assessment & Plan Note (Addendum)
Quit advised

## 2022-04-28 NOTE — ED Notes (Signed)
RN found pt wandering ER. Pt sts " I need to go get my wallett from the car." RN advised pt that he would not need it while in the hospital. Pt sts " okay" RN assisted pt back room and into bed. Pt wife at bedside. Pt had pulled off cardiac leads and pulled out IV. Pt sts " O I am sorry, I thought I was just helping." RN advised pt that the monitor and IV are to help him.

## 2022-04-28 NOTE — Assessment & Plan Note (Signed)
Patient seems to have had a good response to Ativan initiation.  Will continue with same.  Differential diagnosis would include hyperthyroidism and CNS lesions, CAT scan is normal and given the good response to Ativan I really do not think further investigation of CNS is indicated at this time.  We will check a thyroid panel however.  Continue with empiric thiamine.

## 2022-04-28 NOTE — Assessment & Plan Note (Signed)
Complicated by hallucinosis, see above

## 2022-04-28 NOTE — ED Provider Notes (Signed)
[facility] Provider Note   Event Date/Time   First MD Initiated Contact with Patient 04/28/22 1808     (approximate) History  Fall  HPI Chad Brady is a 47 y.o. male with a past medical history of alcohol abuse as well as Suboxone use who presents after alcohol cessation 4 days prior to arrival with significant symptoms of palpitations, diaphoresis, nausea/vomiting, and generalized weakness that has caused multiple falls including a recent fall down a flight of stairs 2 days prior to arrival.  Patient denies loss of consciousness but does endorse significant head trauma.  Patient denies any blood thinner use.  Patient endorses persistent nausea, vomiting, diarrhea, abdominal pain, and headache ROS: Patient currently denies any vision changes, tinnitus, difficulty speaking, facial droop, sore throat, chest pain, shortness of breath, dysuria, or numbness/paresthesias in any extremity   Physical Exam  Triage Vital Signs: ED Triage Vitals  Enc Vitals Group     BP 04/28/22 1800 (!) 169/122     Pulse Rate 04/28/22 1800 (!) 102     Resp 04/28/22 1800 17     Temp 04/28/22 1800 98.1 F (36.7 C)     Temp Source 04/28/22 1800 Oral     SpO2 04/28/22 1800 94 %     Weight 04/28/22 1801 194 lb (88 kg)     Height --      Head Circumference --      Peak Flow --      Pain Score 04/28/22 1800 7     Pain Loc --      Pain Edu? --      Excl. in [place]? --    Most recent vital signs: Vitals:   04/28/22 1800 04/28/22 1830  BP: (!) 169/122 (!) 165/125  Pulse: (!) 102 (!) 103  Resp: 17 13  Temp: 98.1 F (36.7 C)   SpO2: 94% 100%   General: Awake, oriented x4. CV:  Good peripheral perfusion.  Tachycardic.  No MGR Resp:  Normal effort.  Abd:  No distention.  Other:  Middle-aged Caucasian male laying in bed in mild distress secondary to withdrawal syndrome.  Patient diaphoretic, tremulous, and tachycardic ED Results / Procedures / Treatments  Labs (all labs ordered are  listed, but only abnormal results are displayed) Labs Reviewed  COMPREHENSIVE METABOLIC PANEL - Abnormal; Notable for the following components:      Result Value   Sodium 132 (*)    Potassium 3.3 (*)    Chloride 88 (*)    Glucose, Bld 184 (*)    Total Protein 8.3 (*)    AST 66 (*)    ALT 58 (*)    Total Bilirubin 2.4 (*)    Anion gap 17 (*)    All other components within normal limits  CBC - Abnormal; Notable for the following components:   WBC 12.2 (*)    Hemoglobin 17.2 (*)    All other components within normal limits  URINE DRUG SCREEN, QUALITATIVE ([facility] ONLY) - Abnormal; Notable for the following components:   Cannabinoid 50 Ng, Ur [place] POSITIVE (*)    All other components within normal limits  URINALYSIS, ROUTINE W REFLEX MICROSCOPIC - Abnormal; Notable for the following components:   Color, Urine AMBER (*)    APPearance HAZY (*)    Hgb urine dipstick SMALL (*)    Protein, ur >=300 (*)    All other components within normal limits  ETHANOL  CK  PROTIME-INR  APTT   EKG ED  ECG REPORT I, Naaman Plummer, the attending physician, personally viewed and interpreted this ECG. Date: 04/28/2022 EKG Time: 1803 Rate: 111 Rhythm: Tachycardic sinus rhythm QRS Axis: normal Intervals: normal ST/T Wave abnormalities: normal Narrative Interpretation: Tachycardic sinus rhythm.  No evidence of acute ischemia RADIOLOGY ED MD interpretation: CT of the head without contrast interpreted by me shows no evidence of acute abnormalities including no intracerebral hemorrhage, obvious masses, or significant edema -Agree with radiology assessment Official radiology report(s): CT Head Wo Contrast  Result Date: 04/28/2022 CLINICAL DATA:  Head trauma EXAM: CT HEAD WITHOUT CONTRAST TECHNIQUE: Contiguous axial images were obtained from the base of the skull through the vertex without intravenous contrast. RADIATION DOSE REDUCTION: This exam was performed according to the departmental dose-optimization  program which includes automated exposure control, adjustment of the mA and/or kV according to patient size and/or use of iterative reconstruction technique. COMPARISON:  07/01/2020. FINDINGS: Brain: There is no mass, hemorrhage or extra-axial collection. The size and configuration of the ventricles and extra-axial CSF spaces are normal. The brain parenchyma is normal, without acute or chronic infarction. Vascular: No abnormal hyperdensity of the major intracranial arteries or dural venous sinuses. No intracranial atherosclerosis. Skull: The visualized skull base, calvarium and extracranial soft tissues are normal. Sinuses/Orbits: No fluid levels or advanced mucosal thickening of the visualized paranasal sinuses. No mastoid or middle ear effusion. The orbits are normal. IMPRESSION: Normal head CT. Electronically Signed   By: Ulyses Jarred M.D.   On: 04/28/2022 19:05   PROCEDURES: Critical Care performed: No .1-3 Lead EKG Interpretation  Performed by: Naaman Plummer, MD Authorized by: Naaman Plummer, MD     Interpretation: abnormal     ECG rate:  105   ECG rate assessment: tachycardic     Rhythm: sinus tachycardia     Ectopy: none     Conduction: normal    MEDICATIONS ORDERED IN ED: Medications  LORazepam (ATIVAN) tablet 1-4 mg ( Oral See Alternative 04/28/22 1824)    Or  LORazepam (ATIVAN) injection 1-4 mg (4 mg Intravenous Given 04/28/22 1824)  thiamine (VITAMIN B1) tablet 100 mg ( Oral See Alternative 04/28/22 1824)    Or  thiamine (VITAMIN B1) injection 100 mg (100 mg Intravenous Given 0000000 [id number])  folic acid (FOLVITE) tablet 1 mg (1 mg Oral Given 04/28/22 1824)  multivitamin with minerals tablet 1 tablet (1 tablet Oral Given 04/28/22 1824)  ondansetron (ZOFRAN) injection 4 mg (0 mg Intravenous Hold 04/28/22 1930)  potassium chloride (KLOR-CON) packet 20 mEq (20 mEq Oral Given 04/28/22 1937)   IMPRESSION / MDM / ASSESSMENT AND PLAN / ED COURSE  I reviewed the triage vital signs and the  nursing notes.                             The patient is on the cardiac monitor to evaluate for evidence of arrhythmia and/or significant heart rate changes. Patient's presentation is most consistent with acute presentation with potential threat to life or bodily function. Patient 47 year old male who presents with signs and symptoms consistent with alcohol withdrawal after his alcohol cessation 4 days prior to arrival.  Patient describes heavy alcohol use.  Patient's alcohol is negative upon arrival.  Patient has slight leukocytosis to 12.2 which fits with recent vomiting.  Patient does show mild transaminitis and elevation in total bilirubin without significant right upper quadrant pain.  This is likely due to patient's alcohol abuse.  Patient does have  signs of mild hypokalemia and will be repleted orally.  Patient started on CIWA protocol and given thiamine and folate.  Given patient's history of hallucinations, patient meets criteria for inpatient admission given complicated withdrawal syndrome.  I spoke to the on-call hospitalist service who graciously agreed to accept this patient for further evaluation and management.  Dispo: Admit to medicine   FINAL CLINICAL IMPRESSION(S) / ED DIAGNOSES   Final diagnoses:  Alcohol withdrawal syndrome with perceptual disturbance (HCC)  Injury of head, initial encounter  Fall in home, initial encounter   Rx / DC Orders   ED Discharge Orders     None      Note:  This document was prepared using Dragon voice recognition software and may include unintentional dictation errors.   Naaman Plummer, MD 04/28/22 2005

## 2022-04-28 NOTE — ED Notes (Signed)
Patient transported to CT 

## 2022-04-28 NOTE — Assessment & Plan Note (Signed)
Patient reports being on Suboxone for his alcohol use disorder.  At this time we will rule out liver dysfunction by checking INR PTT and also getting liver ultrasound.  Thereafter we will consider starting the patient on naltrexone.  Patient seems to be motivated

## 2022-04-28 NOTE — Assessment & Plan Note (Signed)
Start insulin, check hemoglobin A1c

## 2022-04-28 NOTE — Assessment & Plan Note (Addendum)
Check acute hepatitis panel, liver ultrasound, likely due to alcohol use

## 2022-04-28 NOTE — Assessment & Plan Note (Signed)
I suspect patient had alcohol use related gastritis with associated dyspepsia and vomiting.  At this time vomiting seems to be well-controlled I have no objection to restarting diet abdomen is nontender, benign.  Will treat the patient with IV fluids as well as empiric pantoprazole.  Monitor clinically.

## 2022-04-28 NOTE — Significant Event (Addendum)
About 40 minutes ago, RN reported that patient had taken out his IV, and claimed that he had done so to "help ".  Also patient was found wandering around in the hallway although hide I had previously asked him to please stay in bed and use call button for any assistance needed.  Therefore I reevaluated the patient, patient again appears to be in no distress redirectable, pleasant.  However is clearly not following directions and placing himself at risk for harm. Patient has tolerated diet this evening.  Today's Vitals   04/28/22 1800 04/28/22 1801 04/28/22 1830 04/28/22 2029  BP: (!) 169/122  (!) 165/125 (!) 134/99  Pulse: (!) 102  (!) 103 (!) 102  Resp: 17  13   Temp: 98.1 F (36.7 C)     TempSrc: Oral     SpO2: 94%  100%   Weight:  88 kg    PainSc: 7       Body mass index is 27.84 kg/m.  Other physical exam is consistent as before,  At this time I am concerned that the patient is having delirium in relation to his alcohol withdrawal.  Fortunately patient is not markedly agitated at this time.  Therefore I asked patient's fianc to consider staying with him overnight which would help [place] direct him in terms of giving a familiar face.  Graciously she agreed.  I updated the RN.  Separately I will put in additional Ativan ordered which may be necessary for patient's safety even if patient's CIWA score is not too high.  We will also transfer the patient to progressive care unit and maintain telemetry sitting on patient to keep a close eye for safety.

## 2022-04-28 NOTE — ED Triage Notes (Signed)
Pt is a chronic alcoholic. Pt sts that he drinks a bottle of vodka daily. Pt sts that he stopped drinking last Wednesday and since than he fell on Sunday night and has a bruise to his forehead. Pt sts that he has been shaking and vomiting since Friday with a unsteady gait.
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
## 2022-04-29 ENCOUNTER — Encounter: Payer: Self-pay | Admitting: Internal Medicine

## 2022-04-29 DIAGNOSIS — E876 Hypokalemia: Secondary | ICD-10-CM

## 2022-04-29 DIAGNOSIS — F10932 Alcohol use, unspecified with withdrawal with perceptual disturbance: Secondary | ICD-10-CM | POA: Diagnosis not present

## 2022-04-29 LAB — CBG MONITORING, ED
Glucose-Capillary: 160 mg/dL — ABNORMAL HIGH (ref 70–99)
Glucose-Capillary: 125 mg/dL — ABNORMAL HIGH (ref 70–99)
Glucose-Capillary: 129 mg/dL — ABNORMAL HIGH (ref 70–99)

## 2022-04-29 LAB — CBC
HCT: 43.3 % (ref 39.0–52.0)
Hemoglobin: 14.8 g/dL (ref 13.0–17.0)
MCH: 32.1 pg (ref 26.0–34.0)
MCHC: 34.2 g/dL (ref 30.0–36.0)
MCV: 93.9 fL (ref 80.0–100.0)
Platelets: 172 10*3/uL (ref 150–400)
RBC: 4.61 MIL/uL (ref 4.22–5.81)
RDW: 12 % (ref 11.5–15.5)
WBC: 7.5 10*3/uL (ref 4.0–10.5)
nRBC: 0 % (ref 0.0–0.2)

## 2022-04-29 LAB — BASIC METABOLIC PANEL
Anion gap: 8 (ref 5–15)
BUN: 20 mg/dL (ref 6–20)
CO2: 31 mmol/L (ref 22–32)
Calcium: 8.3 mg/dL — ABNORMAL LOW (ref 8.9–10.3)
Chloride: 92 mmol/L — ABNORMAL LOW (ref 98–111)
Creatinine, Ser: 0.86 mg/dL (ref 0.61–1.24)
GFR, Estimated: >60 mL/min (ref >60)
Glucose, Bld: 154 mg/dL — ABNORMAL HIGH (ref 70–99)
Potassium: 3.1 mmol/L — ABNORMAL LOW (ref 3.5–5.1)
Sodium: 131 mmol/L — ABNORMAL LOW (ref 135–145)

## 2022-04-29 LAB — HEPATITIS PANEL, ACUTE
HCV Ab: NONREACTIVE
Hep A IgM: NONREACTIVE
Hep B C IgM: NONREACTIVE
Hepatitis B Surface Ag: NONREACTIVE

## 2022-04-29 LAB — VITAMIN B12: Vitamin B-12: 605 pg/mL (ref 180–914)

## 2022-04-29 LAB — MAGNESIUM: Magnesium: 1.3 mg/dL — ABNORMAL LOW (ref 1.7–2.4)

## 2022-04-29 LAB — HIV ANTIBODY (ROUTINE TESTING W REFLEX): HIV Screen 4th Generation wRfx: NONREACTIVE

## 2022-04-29 LAB — GLUCOSE, CAPILLARY: Glucose-Capillary: 148 mg/dL — ABNORMAL HIGH (ref 70–99)

## 2022-04-29 MED ORDER — POLYETHYLENE GLYCOL 3350 17 G PO PACK: 17.0000 g | PACK | Freq: Every day | ORAL | Status: DC | PRN

## 2022-04-29 MED ORDER — CALCIUM CARBONATE ANTACID 500 MG PO CHEW
1.0000 | CHEWABLE_TABLET | Freq: Three times a day (TID) | ORAL | Status: DC | PRN
  Administered 2022-04-29: 200 mg via ORAL
  Filled 2022-04-29: qty 1

## 2022-04-29 MED ORDER — SODIUM CHLORIDE 0.9% FLUSH
3.0000 mL | Freq: Two times a day (BID) | INTRAVENOUS | Status: DC
  Administered 2022-04-29 – 2022-05-02 (×9): 3 mL via INTRAVENOUS

## 2022-04-29 MED ORDER — DOCUSATE SODIUM 100 MG PO CAPS
100.0000 mg | ORAL_CAPSULE | Freq: Two times a day (BID) | ORAL | Status: DC
  Administered 2022-04-29 – 2022-05-02 (×8): 100 mg via ORAL
  Filled 2022-04-29 (×8): qty 1

## 2022-04-29 MED ORDER — ENOXAPARIN SODIUM 80 MG/0.8ML IJ SOSY
0.5000 mg/kg | PREFILLED_SYRINGE | INTRAMUSCULAR | Status: DC
  Administered 2022-04-29 – 2022-05-02 (×4): 67.5 mg via SUBCUTANEOUS
  Filled 2022-04-29: qty 0.68
  Filled 2022-04-29 (×3): qty 0.8
  Filled 2022-04-29: qty 0.68

## 2022-04-29 MED ORDER — ENOXAPARIN SODIUM 150 MG/ML IJ SOSY
135.0000 mg | PREFILLED_SYRINGE | INTRAMUSCULAR | Status: DC
  Filled 2022-04-29 (×2): qty 0.9

## 2022-04-29 MED ORDER — PANTOPRAZOLE SODIUM 20 MG PO TBEC
20.0000 mg | DELAYED_RELEASE_TABLET | Freq: Every day | ORAL | Status: DC
  Administered 2022-04-29 – 2022-05-02 (×4): 20 mg via ORAL
  Filled 2022-04-29 (×4): qty 1

## 2022-04-29 MED ORDER — POTASSIUM CHLORIDE CRYS ER 20 MEQ PO TBCR
40.0000 meq | EXTENDED_RELEASE_TABLET | Freq: Two times a day (BID) | ORAL | Status: AC
  Administered 2022-04-29 (×2): 40 meq via ORAL
  Filled 2022-04-29 (×2): qty 2

## 2022-04-29 MED ORDER — MAGNESIUM SULFATE 4 GM/100ML IV SOLN
4.0000 g | Freq: Once | INTRAVENOUS | Status: AC
  Administered 2022-04-29: 4 g via INTRAVENOUS
  Filled 2022-04-29: qty 100

## 2022-04-29 NOTE — Progress Notes (Signed)
Anticoagulation monitoring(Lovenox):  47 yo  male ordered Lovenox 40 mg Q24h    Filed Weights   04/28/22 1801  Weight: 88 kg (194 lb)   BMI 40    Lab Results  Component Value Date   CREATININE 1.10 04/28/2022   CREATININE 0.65 07/01/2020   CREATININE 0.87 10/20/2015   CrCl cannot be calculated (Unknown ideal weight.). Hemoglobin & Hematocrit     Component Value Date/Time   HGB 17.2 (H) 04/28/2022 1805   HCT 48.9 04/28/2022 1805    - Per RN ,  Ht = 6.0 inches, Wt = 294 lbs Per Protocol for Patient with estCrcl > 30 ml/min and BMI > 30, will transition to Lovenox 135 mg Q24h.

## 2022-04-29 NOTE — Progress Notes (Signed)
PROGRESS NOTE   HPI was taken from Dr. Elvia Collum: Chad Brady is a 47 y.o. male who is presenting along with his fiance this afternoon to [facility] ER.  Patient is the primary historian for this encounter.  History is corroborated from the fianc as well.  Patient reports a several year history of using alcohol up to a liter of vodka every day that the patient has been trying "unsuccessfully "to reduce/stop for several years.  Patient reports being on Suboxone for this very reason.  Patient reports having tremors and anxiety every time he reduces alcohol use.  Also reports having dyspepsia like symptoms and vomiting from continued alcohol use.  Regardless patient reports stopping all alcohol use about 4 days ago.  Patient seems to have done well the following day and pretty much slept all day on Thursday and Friday.  However starting Saturday, 2 days ago patient started having tremors, sweating feeling hot and cold, multiple episodes of vomiting and sensation of imbalance with at least 3 falls at home and at his Suboxone clinic.  There is no report of diarrhea or belly pain no recorded fever no rash on his skin no cough no trouble breathing no chest pain no back pain.  Patient has also been having sensation of seeing gnats flying all around the house which are not present as per the fianc.  Also patient has sensation of hearing trains passing which the fianc cannot hear.  Also patient has felt like his fingers are taped.   Given all the symptoms, patient presented to the ER today.  ER course is notable for patient being found to be tachycardic tremulous and having received Ativan, patient reports largely resolution of above symptoms except that he still having some unsteady gait.  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 Continue on CIWA protocol. CT head showed no acute intracranial abnormalities. Continue on IV thiamine  Acute alcoholic hepatitis: liver US showed fatty liver. Hepatitis panel is pending   Possible gastritis: likely secondary to alcohol abuse. Continue on PPI & IVFs  Hypokalemia: potassium given  Hypomagnesemia: mg sulfate given   Hyperglycemia: no hx of DM. HbA1c is pending  Marijuana use: received drug cessation counseling      DVT prophylaxis: SCDs, lovenox  Code Status: full  Family Communication:  Disposition Plan: likely d/c back home   Level of care: Progressive  Status is: Inpatient Remains inpatient appropriate because: severity unknown     Consultants:    Procedures:   Antimicrobials:    Subjective: Pt c/o headache  Objective: Vitals:   04/29/22 0115 04/29/22 0230 04/29/22 0517 04/29/22 0600  BP:  128/84  (!) 141/90  Pulse: 77 77  74  Resp: 20 19  17   Temp:  98.3 F (36.8 C)    TempSrc:  Oral    SpO2: 96% 97%  98%  Weight:   133.4 kg   Height:   6' (1.829 m)     Intake/Output Summary (Last 24 hours) at 04/29/2022 0859 Last data filed at 04/29/2022 0225 Gross per 24 hour  Intake 3 ml  Output --  Net 3 ml  Filed Weights   04/28/22 1801 04/29/22 0517  Weight: 88 kg 133.4 kg    Examination:  General exam: Appears calm and comfortable  Respiratory system: Clear to auscultation. Respiratory effort normal. Cardiovascular system: S1 & S2 +. No rubs, gallops or clicks.  Gastrointestinal system: Abdomen is nondistended, soft and nontender. Normal bowel sounds heard. Central nervous system: Alert and oriented. Moves all extremities  Psychiatry: Judgement and insight appear normal. Mood & affect appropriate.     Data Reviewed: I have personally reviewed following labs and  imaging studies  CBC: Recent Labs  Lab 04/28/22 1805 04/29/22 0558  WBC 12.2* 7.5  HGB 17.2* 14.8  HCT 48.9 43.3  MCV 91.6 93.9  PLT 265 [id number]   Basic Metabolic Panel: Recent Labs  Lab 04/28/22 1805 04/29/22 0558  NA 132* 131*  K 3.3* 3.1*  CL 88* 92*  CO2 27 31  GLUCOSE 184* 154*  BUN 20 20  CREATININE 1.10 0.86  CALCIUM 9.0 8.3*   GFR: Estimated Creatinine Clearance: 150 mL/min (by C-G formula based on SCr of 0.86 mg/dL). Liver Function Tests: Recent Labs  Lab 04/28/22 1805  AST 66*  ALT 58*  ALKPHOS 64  BILITOT 2.4*  PROT 8.3*  ALBUMIN 4.6   No results for input(s): "LIPASE", "AMYLASE" in the last 168 hours. No results for input(s): "AMMONIA" in the last 168 hours. Coagulation Profile: Recent Labs  Lab 04/28/22 2110  INR 1.1   Cardiac Enzymes: Recent Labs  Lab 04/28/22 1805  CKTOTAL 264   BNP (last 3 results) No results for input(s): "PROBNP" in the last 8760 hours. HbA1C: No results for input(s): "HGBA1C" in the last 72 hours. CBG: Recent Labs  Lab 04/28/22 2109 04/29/22 0850  GLUCAP 138* 160*   Lipid Profile: No results for input(s): "CHOL", "HDL", "LDLCALC", "TRIG", "CHOLHDL", "LDLDIRECT" in the last 72 hours. Thyroid Function Tests: No results for input(s): "TSH", "T4TOTAL", "FREET4", "T3FREE", "THYROIDAB" in the last 72 hours. Anemia Panel: No results for input(s): "VITAMINB12", "FOLATE", "FERRITIN", "TIBC", "IRON", "RETICCTPCT" in the last 72 hours. Sepsis Labs: No results for input(s): "PROCALCITON", "LATICACIDVEN" in the last 168 hours.  Recent Results (from the past 240 hour(s))  Culture, blood (Routine X 2) w Reflex to ID Panel     Status: None (Preliminary result)   Collection Time: 04/28/22  9:10 PM   Specimen: BLOOD  Result Value Ref Range Status   Specimen Description BLOOD BLOOD RIGHT ARM  Final   Special Requests   Final    BOTTLES DRAWN AEROBIC AND ANAEROBIC Blood Culture adequate volume   Culture   Final    NO  GROWTH < 12 HOURS Performed at [facility], [address], [place], [place] [address]    Report Status PENDING  Incomplete  Culture, blood (Routine X 2) w Reflex to ID Panel     Status: None (Preliminary result)   Collection Time: 04/28/22  9:10 PM   Specimen: BLOOD  Result Value Ref Range Status   Specimen Description BLOOD BLOOD LEFT ARM  Final   Special Requests   Final    BOTTLES DRAWN AEROBIC AND ANAEROBIC Blood Culture adequate volume   Culture   Final    NO GROWTH < 12 HOURS Performed at [facility], [address], [place], [place] [address]    Report Status PENDING  Incomplete         Radiology Studies: US Abdomen Limited RUQ (LIVER/GB)  Result Date: 04/28/2022 CLINICAL DATA:  Hepatitis EXAM: ULTRASOUND  ABDOMEN LIMITED RIGHT UPPER QUADRANT COMPARISON:  None Available. FINDINGS: Gallbladder: No gallstones or wall thickening visualized. No sonographic Murphy sign noted by sonographer. Common bile duct: Diameter: 5 mm Liver: Increased echogenicity is noted without focal mass consistent with fatty infiltration. Portal vein is patent on color Doppler imaging with normal direction of blood flow towards the liver. Other: None. IMPRESSION: Fatty liver. Electronically Signed   By: Inez Catalina M.D.   On: 04/28/2022 21:00   CT Head Wo Contrast  Result Date: 04/28/2022 CLINICAL DATA:  Head trauma EXAM: CT HEAD WITHOUT CONTRAST TECHNIQUE: Contiguous axial images were obtained from the base of the skull through the vertex without intravenous contrast. RADIATION DOSE REDUCTION: This exam was performed according to the departmental dose-optimization program which includes automated exposure control, adjustment of the mA and/or kV according to patient size and/or use of iterative reconstruction technique. COMPARISON:  07/01/2020. FINDINGS: Brain: There is no mass, hemorrhage or extra-axial collection. The size and configuration of the ventricles and extra-axial CSF spaces  are normal. The brain parenchyma is normal, without acute or chronic infarction. Vascular: No abnormal hyperdensity of the major intracranial arteries or dural venous sinuses. No intracranial atherosclerosis. Skull: The visualized skull base, calvarium and extracranial soft tissues are normal. Sinuses/Orbits: No fluid levels or advanced mucosal thickening of the visualized paranasal sinuses. No mastoid or middle ear effusion. The orbits are normal. IMPRESSION: Normal head CT. Electronically Signed   By: Ulyses Jarred M.D.   On: 04/28/2022 19:05        Scheduled Meds:  docusate sodium  100 mg Oral BID   enoxaparin (LOVENOX) injection  135 mg Subcutaneous [id number]   folic acid  1 mg Oral Daily   insulin aspart  0-5 Units Subcutaneous QHS   insulin aspart  0-9 Units Subcutaneous TID WC   multivitamin with minerals  1 tablet Oral Daily   ondansetron (ZOFRAN) IV  4 mg Intravenous Once   pantoprazole  20 mg Oral Daily   potassium chloride  40 mEq Oral BID   sodium chloride flush  3 mL Intravenous Q12H   thiamine  100 mg Oral Daily   Or   thiamine  100 mg Intravenous Daily   Continuous Infusions:  dextrose 5% lactated ringers 125 mL/hr at 04/29/22 0625     LOS: 1 day    Time spent: 35 mins     Wyvonnia Dusky, MD [facility] Pager [phone]  If 7PM-7AM, please contact night-coverage [email or web address] 04/29/2022, 8:59 AM

## 2022-04-29 NOTE — Progress Notes (Signed)
PHARMACIST - PHYSICIAN COMMUNICATION  CONCERNING:  Enoxaparin (Lovenox) for DVT Prophylaxis    RECOMMENDATION: Patient was prescribed enoxaprin  for VTE prophylaxis.   Filed Weights   04/28/22 1801 04/29/22 0517  Weight: 88 kg (194 lb) 133.4 kg (294 lb)    Body mass index is 39.87 kg/m.  Estimated Creatinine Clearance: 150 mL/min (by C-G formula based on SCr of 0.86 mg/dL).   Based on [place] patient is candidate for enoxaparin 0.5mg /kg TBW SQ every 24 hours based on BMI being >30.   DESCRIPTION: Pharmacy has adjusted enoxaparin dose per [facility] policy.  Patient is now receiving enoxaparin 67.5 mg every 24 hours    Pernell Dupre, PharmD, BCPS Clinical Pharmacist 04/29/2022 10:35 AM

## 2022-04-29 NOTE — ED Notes (Signed)
Patient alert and oriented. Patient resting with significant other in bed.

## 2022-04-29 NOTE — ED Notes (Signed)
Pt A&Ox4 at this time. Pt up to toilet with unsteady gait. Girlfriend at bedside. Pt connected back to monitor. New gown placed on patient. CIWA 1 at this time.

## 2022-04-29 NOTE — ED Notes (Signed)
Patient resting in bed, no needs expressed at this time

## 2022-04-30 DIAGNOSIS — F10932 Alcohol use, unspecified with withdrawal with perceptual disturbance: Secondary | ICD-10-CM | POA: Diagnosis not present

## 2022-04-30 LAB — CBC
HCT: 37.9 % — ABNORMAL LOW (ref 39.0–52.0)
Hemoglobin: 13.1 g/dL (ref 13.0–17.0)
MCH: 32.3 pg (ref 26.0–34.0)
MCHC: 34.6 g/dL (ref 30.0–36.0)
MCV: 93.6 fL (ref 80.0–100.0)
Platelets: 151 10*3/uL (ref 150–400)
RBC: 4.05 MIL/uL — ABNORMAL LOW (ref 4.22–5.81)
RDW: 11.9 % (ref 11.5–15.5)
WBC: 5.4 10*3/uL (ref 4.0–10.5)
nRBC: 0 % (ref 0.0–0.2)

## 2022-04-30 LAB — COMPREHENSIVE METABOLIC PANEL
ALT: 70 U/L — ABNORMAL HIGH (ref 0–44)
AST: 95 U/L — ABNORMAL HIGH (ref 15–41)
Albumin: 3.4 g/dL — ABNORMAL LOW (ref 3.5–5.0)
Alkaline Phosphatase: 51 U/L (ref 38–126)
Anion gap: 8 (ref 5–15)
BUN: 13 mg/dL (ref 6–20)
CO2: 26 mmol/L (ref 22–32)
Calcium: 8.4 mg/dL — ABNORMAL LOW (ref 8.9–10.3)
Chloride: 101 mmol/L (ref 98–111)
Creatinine, Ser: 0.67 mg/dL (ref 0.61–1.24)
GFR, Estimated: >60 mL/min (ref >60)
Glucose, Bld: 146 mg/dL — ABNORMAL HIGH (ref 70–99)
Potassium: 3.4 mmol/L — ABNORMAL LOW (ref 3.5–5.1)
Sodium: 135 mmol/L (ref 135–145)
Total Bilirubin: 1.1 mg/dL (ref 0.3–1.2)
Total Protein: 6.1 g/dL — ABNORMAL LOW (ref 6.5–8.1)

## 2022-04-30 LAB — THYROID PANEL WITH TSH
Free Thyroxine Index: 1.9 (ref 1.2–4.9)
T3 Uptake Ratio: 28 % (ref 24–39)
T4, Total: 6.8 ug/dL (ref 4.5–12.0)
TSH: 1.77 u[IU]/mL (ref 0.450–4.500)

## 2022-04-30 LAB — MRSA NEXT GEN BY PCR, NASAL: MRSA by PCR Next Gen: NOT DETECTED

## 2022-04-30 LAB — PHOSPHORUS: Phosphorus: 4 mg/dL (ref 2.5–4.6)

## 2022-04-30 LAB — GLUCOSE, CAPILLARY
Glucose-Capillary: 115 mg/dL — ABNORMAL HIGH (ref 70–99)
Glucose-Capillary: 128 mg/dL — ABNORMAL HIGH (ref 70–99)
Glucose-Capillary: 150 mg/dL — ABNORMAL HIGH (ref 70–99)

## 2022-04-30 LAB — HEMOGLOBIN A1C
Hgb A1c MFr Bld: 6.3 % — ABNORMAL HIGH (ref 4.8–5.6)
Mean Plasma Glucose: 134 mg/dL

## 2022-04-30 LAB — MAGNESIUM: Magnesium: 1.8 mg/dL (ref 1.7–2.4)

## 2022-04-30 MED ORDER — PHENOBARBITAL SODIUM 65 MG/ML IJ SOLN: 32.5000 mg | Freq: Three times a day (TID) | INTRAMUSCULAR | Status: DC

## 2022-04-30 MED ORDER — DIAZEPAM 5 MG/ML IJ SOLN
INTRAMUSCULAR | Status: AC
  Administered 2022-04-30: 10 mg
  Filled 2022-04-30: qty 2

## 2022-04-30 MED ORDER — LORAZEPAM 2 MG/ML IJ SOLN
4.0000 mg | Freq: Once | INTRAMUSCULAR | Status: AC
  Administered 2022-04-30: 4 mg via INTRAVENOUS

## 2022-04-30 MED ORDER — LORAZEPAM 2 MG/ML IJ SOLN
1.0000 mg | INTRAMUSCULAR | Status: DC | PRN
  Administered 2022-04-30 (×2): 1 mg via INTRAVENOUS
  Filled 2022-04-30 (×2): qty 1

## 2022-04-30 MED ORDER — PHENOBARBITAL SODIUM 65 MG/ML IJ SOLN: 65.0000 mg | Freq: Three times a day (TID) | INTRAMUSCULAR | Status: DC

## 2022-04-30 MED ORDER — DEXMEDETOMIDINE HCL IN NACL 400 MCG/100ML IV SOLN
INTRAVENOUS | Status: AC
  Administered 2022-04-30: 0.2 ug/kg/h via INTRAVENOUS
  Filled 2022-04-30: qty 100

## 2022-04-30 MED ORDER — PHENOBARBITAL SODIUM 130 MG/ML IJ SOLN
97.5000 mg | Freq: Three times a day (TID) | INTRAMUSCULAR | Status: AC
  Administered 2022-04-30 – 2022-05-02 (×6): 97.5 mg via INTRAVENOUS
  Filled 2022-04-30 (×6): qty 1

## 2022-04-30 MED ORDER — CHLORHEXIDINE GLUCONATE CLOTH 2 % EX PADS
6.0000 | MEDICATED_PAD | Freq: Every day | CUTANEOUS | Status: DC
  Administered 2022-04-30 – 2022-05-02 (×3): 6 via TOPICAL

## 2022-04-30 MED ORDER — LORAZEPAM 1 MG PO TABS: 1.0000 mg | ORAL_TABLET | Freq: Four times a day (QID) | ORAL | Status: DC | PRN

## 2022-04-30 MED ORDER — LORAZEPAM 2 MG/ML IJ SOLN
2.0000 mg | INTRAMUSCULAR | Status: DC | PRN
  Administered 2022-04-30: 2 mg via INTRAVENOUS
  Filled 2022-04-30 (×3): qty 1

## 2022-04-30 MED ORDER — DIAZEPAM 5 MG/ML IJ SOLN
5.0000 mg | Freq: Once | INTRAMUSCULAR | Status: AC
  Administered 2022-04-30: 5 mg via INTRAVENOUS

## 2022-04-30 MED ORDER — DEXMEDETOMIDINE HCL IN NACL 400 MCG/100ML IV SOLN
0.0000 ug/kg/h | INTRAVENOUS | Status: DC
  Administered 2022-04-30: 1.2 ug/kg/h via INTRAVENOUS
  Administered 2022-04-30: 1 ug/kg/h via INTRAVENOUS
  Administered 2022-04-30: 1.1 ug/kg/h via INTRAVENOUS
  Administered 2022-04-30: 0.8 ug/kg/h via INTRAVENOUS
  Administered 2022-05-01 (×3): 1.2 ug/kg/h via INTRAVENOUS
  Administered 2022-05-01: 1 ug/kg/h via INTRAVENOUS
  Administered 2022-05-01: 1.2 ug/kg/h via INTRAVENOUS
  Administered 2022-05-01: 0.8 ug/kg/h via INTRAVENOUS
  Filled 2022-04-30 (×8): qty 100

## 2022-04-30 MED ORDER — PHENOBARBITAL SODIUM 65 MG/ML IJ SOLN
65.0000 mg | Freq: Three times a day (TID) | INTRAMUSCULAR | Status: DC
  Administered 2022-05-02: 65 mg via INTRAVENOUS
  Filled 2022-04-30: qty 1

## 2022-04-30 MED ORDER — LORAZEPAM 2 MG/ML IJ SOLN
1.0000 mg | INTRAMUSCULAR | Status: DC | PRN
  Administered 2022-05-01 (×3): 1 mg via INTRAVENOUS
  Filled 2022-04-30 (×3): qty 1

## 2022-04-30 MED ORDER — LORAZEPAM 2 MG/ML PO CONC: 4.0000 mg | Freq: Once | ORAL | Status: DC

## 2022-04-30 MED ORDER — NICOTINE 21 MG/24HR TD PT24
21.0000 mg | MEDICATED_PATCH | Freq: Every day | TRANSDERMAL | Status: DC
  Administered 2022-04-30 – 2022-05-03 (×4): 21 mg via TRANSDERMAL
  Filled 2022-04-30 (×4): qty 1

## 2022-04-30 MED ORDER — SODIUM CHLORIDE 0.9 % IV SOLN
260.0000 mg | Freq: Once | INTRAVENOUS | Status: AC
  Administered 2022-04-30: 260 mg via INTRAVENOUS
  Filled 2022-04-30: qty 2

## 2022-04-30 NOTE — Progress Notes (Signed)
TOC acknowledges consult for SA resources, will meet with patient when medically appropriate as currently noted to be agitated and not oriented.   SA resources have been added to the AVS.   Kelby Fam, [employer], MSW, [place]

## 2022-04-30 NOTE — Progress Notes (Signed)
       CROSS COVER NOTE  NAME: Chad Brady MRN: [id number] DOB : [birth year] ATTENDING PHYSICIAN: Gertie Fey, MD    Date of Service   04/30/2022   HPI/Events of Note   Report/Request  "this pt is here w/ ETOH W/D and falls from home. His wife is at bedside but not helpful in redirecting him. He jumps out of bed and thinks he's at home IV ativan per CIWA protocol in use and is not effective. It seems that he's only getting worse with the Ativan. He has pulled out his IV just now also"   Bedside eval  At bedside Chad Brady is hallucinating, delirious, restless/anxious, and on his cell phone he tells me he would like to go home to get ready for work. Significant other at bedside. He was not agitated on my assessment, though staff report he has been kicking at them.  In ICU, Chad Brady is goal directed and focused on getting out of bed so that he can get ready for work. He is perturbed with staff when they try to verbally redirect him and becomes combative when staff physically redirects him. I was hopeful phenobarbital would be effective for symptom control. Unfortunately, he has had an abrupt escalation in behavior not responsive to Ativan, Valium, or Phenobarbital and is punching and kicking at staff members. Precedex started for patient and staff safety.  Interventions   Assessment/Plan:  Continue PRN Ativan Diazepam 5 mg x1  Transfer to Stepdown for Phenobarbital taper Precedex, phenobarbital discontinued       To reach the provider On-Call:   7AM- 7PM see care teams to locate the attending and reach out to them via [email or web address] Password: [email or web address] 7PM-7AM contact night-coverage If you still have difficulty reaching the appropriate provider, please page the [facility] (Director on Call) for [facility] on amion for assistance  This document was prepared using Systems analyst and may include unintentional dictation errors.  Neomia Glass DNP, MBA, FNP-BC, PMHNP-BC Nurse  Practitioner [facility] [address] Pager [phone]

## 2022-04-30 NOTE — Progress Notes (Addendum)
Notified Chad Glass, NP of aggressive behavior continuing w/out any success from medications. Continues to try to get out of bed and says he is going home. Attempts to keep him calm are worsening his agitation. Increasingly more verbally and physically aggressive to wife and staff, w/ attempts to kick at staff . Wife left the bedside to go home after his agitation progressed.  Security at bedside now.  New orders for transfer to ICU.

## 2022-04-30 NOTE — Progress Notes (Signed)
NP at bedside at this time  

## 2022-04-30 NOTE — Progress Notes (Addendum)
Continued confusion that is worstening. Jumping out of bed and thinking he is at home and needs to go to work. Hallucinations and saying he can see someone in the window. His wife is at bedside with him and came to nurses station saying, "He is acting crazy in here."  He pulled out his IV and continues pulling off telemetry monitor. Fidgeting and unable to sit in one place very long. Tremors noted to BUE on assessment. CIWA protocol in use /see EMAR. Ativan has not been effective with agitation and anxiousness.  Notified Neomia Glass, NP.

## 2022-04-30 NOTE — Progress Notes (Signed)
PROGRESS NOTE    Chad Brady  [id number] DOB: May 10, 1975 DOA: 04/28/2022 PCP: Ida Rogue, MD  [id number]/[id number]  LOS: 2 days   Brief hospital course:   Assessment & Plan: Chad Brady is a 47 y.o. male who is presenting along with his fiance this afternoon to [facility] ER.  Patient is the primary historian for this encounter.  History is corroborated from the fianc as well.  Patient reports a several year history of using alcohol up to a liter of vodka every day that the patient has been trying "unsuccessfully "to reduce/stop for several years.  Patient reports being on Suboxone for this very reason.  Patient reports having tremors and anxiety every time he reduces alcohol use.  Also reports having dyspepsia like symptoms and vomiting from continued alcohol use.  Regardless patient reports stopping all alcohol use about 4 days ago.  Patient seems to have done well the following day and pretty much slept all day on Thursday and Friday.  However starting Saturday, 2 days ago patient started having tremors, sweating feeling hot and cold, multiple episodes of vomiting and sensation of imbalance with at least 3 falls at home and at his Suboxone clinic.  There is no report of diarrhea or belly pain no recorded fever no rash on his skin no cough no trouble breathing no chest pain no back pain.  Patient has also been having sensation of seeing gnats flying all around the house which are not present as per the fianc.  Also patient has sensation of hearing trains passing which the fianc cannot hear.  Also patient has felt like his fingers are taped.   Alcohol w/drawal:  w/ hallucinations. CT head showed no acute intracranial abnormalities.  Continue ativan --cont precedex gtt --start phenobarbital  Continue on IV thiamine   Acute alcoholic hepatitis:  liver US showed fatty liver. Hepatitis panel neg.   Hypokalemia:  --monitor and replete PRN   Hypomagnesemia:  --monitor and replete  PRN   Hyperglycemia:  Pre-diabetes --A1c 6.3 --d/c BG checks and SSI   Marijuana use:  received drug cessation counseling     DVT prophylaxis: Lovenox SQ Code Status: Full code  Family Communication:  Level of care: ICU Dispo:   The patient is from: home Anticipated d/c is to: home Anticipated d/c date is: 2-3 days   Subjective and Interval History:  Pt was agitated and confused overnight, trying to get out of bed to leave.  Agitation and confusion continued during the day.   Objective: Vitals:   04/30/22 1700 04/30/22 1730 04/30/22 1800 04/30/22 1830  BP: (!) 134/103 (!) 122/93 (!) 128/105 (!) 127/93  Pulse: (!) 58  (!) 55 (!) 55  Resp: (!) 24 (!) 22 16 10   Temp:      TempSrc:      SpO2: 97%  93% 92%  Weight:      Height:        Intake/Output Summary (Last 24 hours) at 04/30/2022 1910 Last data filed at 04/30/2022 1807 Gross per 24 hour  Intake 749.23 ml  Output 900 ml  Net -150.77 ml   Filed Weights   04/28/22 1801 04/29/22 0517 04/30/22 0341  Weight: 88 kg 133.4 kg 86.9 kg    Examination:   Constitutional: NAD, alert, oriented to self only, fidgeting   HEENT: conjunctivae and lids normal, EOMI CV: No cyanosis.   RESP: normal respiratory effort, on RA Neuro: II - XII grossly intact.     Data Reviewed: I  have personally reviewed labs and imaging studies  Time spent: 50 minutes  Enzo Bi, MD [facility] If 7PM-7AM, please contact night-coverage 04/30/2022, 7:10 PM

## 2022-04-30 NOTE — Progress Notes (Signed)
Sent Dr. Billie Ruddy secure chat and made her aware that patient's diastolic blood pressure is staying 96-105 current BP 128/105 HR 58-65 on precedex. MD acknowledged and stated it's okay. No orders given at this time.

## 2022-04-30 NOTE — Progress Notes (Signed)
       CROSS COVER NOTE  NAME: Chad Brady MRN: [id number] DOB : Jul 04, 1975    HPI/Events of Note   Report:Patient found with empty suboxone wrapper after going through his wallet All personal belongings gone through by staff and no medications found  On review of chart: Presented to ER with withdrawal symptoms 3/25/ On 3/26 he required transfer to ICU for precedex and phenobarbital secondary to escalation in withdrawal, agitation and combative   Assessment and  Interventions   Assessment: VSS. Somnolent at moment but sedated with ativan precedex nd phenobarb. Nurse states he is at his baseline. Girlfriend at bedside states patient keeps the suboxone wrappers to take back to the clinic he follows outpatient.   Plan: Continue to monitor closely in ICU Discussed with girlfriend at bedside he cannot have any outsiide meds and she understands completely. She denies he has had any and is planning to take all of his personal belongings home so he has none to go through      Kathlene Cote NP [facility]

## 2022-04-30 NOTE — Discharge Instructions (Signed)
                  Intensive Outpatient Programs  [facility]    [facility] [address]     [address] [place],  [place]     [place], [place] [phone]      [phone]  [facility]   [facility]  (Inpatient and outpatient)  [phone] (Suboxone and Methadone) [address]           [phone]           [facility]    [facility] - Intensive Outpatient [address]     [address] [place], [place] [address]     [place], [place]  [phone]      [phone]  [facility] (Outpatient, Inpatient, [facility]  [facility] (Groups and [facility]idental) (insurance only) [phone]    [place], [place]          [phone]       [facility]    [facility] (for caregivers and family) [address]     [address] [place], [place]     [place], [place] [phone]      [phone]  Residential Treatment Programs  [facility]  [facility](2 years) Residential: 90 days)  [facility] ([facility] [address]      [address] [place], [place]     [place], [place] [phone]      [phone] or [phone]  [facility]    [facility] [address]      [address] [place], [place]     , [place] [phone]      [phone]  [facility]   [facility] ([facility] [address]     [address] [place], [place] [address]     [place], [place] [phone]      [phone] Admissions: 8am-3pm M-F  [facility]: Residential Program (90 Days)              [facility]: [facility]  [place], [place]     [place], [place]  [phone] or [phone]    (Walk in Hours over the weekend or by referral)   [facility]: [facility]:[phone] (for crisis  response 24 hours a day) 

## 2022-05-01 DIAGNOSIS — F10932 Alcohol use, unspecified with withdrawal with perceptual disturbance: Secondary | ICD-10-CM | POA: Diagnosis not present

## 2022-05-01 LAB — BASIC METABOLIC PANEL
Anion gap: 9 (ref 5–15)
BUN: 11 mg/dL (ref 6–20)
CO2: 23 mmol/L (ref 22–32)
Calcium: 8.7 mg/dL — ABNORMAL LOW (ref 8.9–10.3)
Chloride: 105 mmol/L (ref 98–111)
Creatinine, Ser: 0.67 mg/dL (ref 0.61–1.24)
GFR, Estimated: >60 mL/min (ref >60)
Glucose, Bld: 162 mg/dL — ABNORMAL HIGH (ref 70–99)
Potassium: 4.1 mmol/L (ref 3.5–5.1)
Sodium: 137 mmol/L (ref 135–145)

## 2022-05-01 LAB — CBC
HCT: 43 % (ref 39.0–52.0)
Hemoglobin: 14.4 g/dL (ref 13.0–17.0)
MCH: 32.9 pg (ref 26.0–34.0)
MCHC: 33.5 g/dL (ref 30.0–36.0)
MCV: 98.2 fL (ref 80.0–100.0)
Platelets: 144 10*3/uL — ABNORMAL LOW (ref 150–400)
RBC: 4.38 MIL/uL (ref 4.22–5.81)
RDW: 11.9 % (ref 11.5–15.5)
WBC: 5.3 10*3/uL (ref 4.0–10.5)
nRBC: 0 % (ref 0.0–0.2)

## 2022-05-01 LAB — MAGNESIUM: Magnesium: 1.7 mg/dL (ref 1.7–2.4)

## 2022-05-01 LAB — TROPONIN I (HIGH SENSITIVITY)
Troponin I (High Sensitivity): 8 ng/L (ref <18)
Troponin I (High Sensitivity): 9 ng/L (ref <18)

## 2022-05-01 NOTE — Progress Notes (Signed)
PROGRESS NOTE    JB KODA  [id number] DOB: May 02, 1975 DOA: 04/28/2022 PCP: Ida Rogue, MD  [id number]/[id number]  LOS: 3 days   Brief hospital course:   Assessment & Plan: Chad Brady is a 47 y.o. male who is presenting along with his fiance this afternoon to [facility] ER.  Patient is the primary historian for this encounter.  History is corroborated from the fianc as well.  Patient reports a several year history of using alcohol up to a liter of vodka every day that the patient has been trying "unsuccessfully "to reduce/stop for several years.  Patient reports being on Suboxone for this very reason.  Patient reports having tremors and anxiety every time he reduces alcohol use.  Also reports having dyspepsia like symptoms and vomiting from continued alcohol use.  Regardless patient reports stopping all alcohol use about 4 days ago.  Patient seems to have done well the following day and pretty much slept all day on Thursday and Friday.  However starting Saturday, 2 days ago patient started having tremors, sweating feeling hot and cold, multiple episodes of vomiting and sensation of imbalance with at least 3 falls at home and at his Suboxone clinic.  There is no report of diarrhea or belly pain no recorded fever no rash on his skin no cough no trouble breathing no chest pain no back pain.  Patient has also been having sensation of seeing gnats flying all around the house which are not present as per the fianc.  Also patient has sensation of hearing trains passing which the fianc cannot hear.  Also patient has felt like his fingers are taped.   Alcohol w/drawal:  w/ hallucinations. CT head showed no acute intracranial abnormalities.  --Continue ativan --taper down precedex gtt --phenobarbital taper Continue on IV thiamine   Acute alcoholic hepatitis:  liver US showed fatty liver. Hepatitis panel neg.   Hypokalemia:  --monitor and replete PRN   Hypomagnesemia:  --monitor and  replete PRN   Hyperglycemia:  Pre-diabetes --A1c 6.3 --d/c'ed BG checks and SSI   Marijuana use:  received drug cessation counseling     DVT prophylaxis: Lovenox SQ Code Status: Full code  Family Communication:  Level of care: ICU Dispo:   The patient is from: home Anticipated d/c is to: home Anticipated d/c date is: 2-3 days   Subjective and Interval History:  Pt reported feeling better this morning, and mental status much improved, was oriented.  Later, RN reported some visual hallucination.   Objective: Vitals:   05/01/22 1500 05/01/22 1600 05/01/22 1700 05/01/22 1800  BP: 105/82 120/86 (!) 145/99 131/86  Pulse: 67 62 60   Resp: (!) 23 18 (!) 21 18  Temp:  97.9 F (36.6 C)    TempSrc:  Oral    SpO2: 96% 95% 98%   Weight:      Height:        Intake/Output Summary (Last 24 hours) at 05/01/2022 1905 Last data filed at 05/01/2022 1738 Gross per 24 hour  Intake 1635.08 ml  Output 775 ml  Net 860.08 ml   Filed Weights   04/28/22 1801 04/29/22 0517 04/30/22 0341  Weight: 88 kg 133.4 kg 86.9 kg    Examination:   Constitutional: NAD, AAOx3 HEENT: conjunctivae and lids normal, EOMI CV: No cyanosis.   RESP: normal respiratory effort, on RA Neuro: II - XII grossly intact.     Data Reviewed: I have personally reviewed labs and imaging studies  Time spent: 35 minutes  Enzo Bi, MD [facility] If 7PM-7AM, please contact night-coverage 05/01/2022, 7:05 PM

## 2022-05-02 DIAGNOSIS — F10932 Alcohol use, unspecified with withdrawal with perceptual disturbance: Secondary | ICD-10-CM | POA: Diagnosis not present

## 2022-05-02 LAB — CBC
HCT: 39.3 % (ref 39.0–52.0)
Hemoglobin: 13.7 g/dL (ref 13.0–17.0)
MCH: 32.8 pg (ref 26.0–34.0)
MCHC: 34.9 g/dL (ref 30.0–36.0)
MCV: 94 fL (ref 80.0–100.0)
Platelets: 155 10*3/uL (ref 150–400)
RBC: 4.18 MIL/uL — ABNORMAL LOW (ref 4.22–5.81)
RDW: 11.8 % (ref 11.5–15.5)
WBC: 7.8 10*3/uL (ref 4.0–10.5)
nRBC: 0 % (ref 0.0–0.2)

## 2022-05-02 LAB — BASIC METABOLIC PANEL
Anion gap: 6 (ref 5–15)
BUN: 10 mg/dL (ref 6–20)
CO2: 28 mmol/L (ref 22–32)
Calcium: 8.7 mg/dL — ABNORMAL LOW (ref 8.9–10.3)
Chloride: 99 mmol/L (ref 98–111)
Creatinine, Ser: 0.85 mg/dL (ref 0.61–1.24)
GFR, Estimated: >60 mL/min (ref >60)
Glucose, Bld: 187 mg/dL — ABNORMAL HIGH (ref 70–99)
Potassium: 4.5 mmol/L (ref 3.5–5.1)
Sodium: 133 mmol/L — ABNORMAL LOW (ref 135–145)

## 2022-05-02 LAB — MAGNESIUM: Magnesium: 1.6 mg/dL — ABNORMAL LOW (ref 1.7–2.4)

## 2022-05-02 LAB — PHOSPHORUS: Phosphorus: 4.8 mg/dL — ABNORMAL HIGH (ref 2.5–4.6)

## 2022-05-02 MED ORDER — PHENOBARBITAL 32.4 MG PO TABS: 32.4000 mg | ORAL_TABLET | Freq: Three times a day (TID) | ORAL | Status: DC

## 2022-05-02 MED ORDER — MAGNESIUM SULFATE 4 GM/100ML IV SOLN
4.0000 g | Freq: Once | INTRAVENOUS | Status: AC
  Administered 2022-05-02: 4 g via INTRAVENOUS
  Filled 2022-05-02: qty 100

## 2022-05-02 MED ORDER — LISINOPRIL 20 MG PO TABS
20.0000 mg | ORAL_TABLET | Freq: Every day | ORAL | Status: DC
  Administered 2022-05-02: 20 mg via ORAL
  Filled 2022-05-02: qty 1

## 2022-05-02 MED ORDER — INDOMETHACIN ER 75 MG PO CPCR
75.0000 mg | ORAL_CAPSULE | Freq: Every day | ORAL | Status: DC
  Filled 2022-05-02: qty 1

## 2022-05-02 MED ORDER — INDOMETHACIN 50 MG PO CAPS
75.0000 mg | ORAL_CAPSULE | Freq: Every day | ORAL | Status: DC
  Administered 2022-05-02: 75 mg via ORAL
  Filled 2022-05-02: qty 1

## 2022-05-02 MED ORDER — PHENOBARBITAL 32.4 MG PO TABS
64.8000 mg | ORAL_TABLET | Freq: Three times a day (TID) | ORAL | Status: DC
  Administered 2022-05-02 – 2022-05-03 (×2): 64.8 mg via ORAL
  Filled 2022-05-02 (×2): qty 2

## 2022-05-02 NOTE — Progress Notes (Signed)
       CROSS COVER NOTE  NAME: Chad Brady MRN: [id number] DOB : 1975-10-01    HPI/Events of Note   Report:right knee pain related to gout; requesting indomethicin as used at home   On review of chart:    Assessment and  Interventions   Assessment: Patient states diagnosed at age of 10 and treated with course of indomethicin He demonstrates knowledge of disease process and is able to identify his triggers bologna, hot dogs and shrimp. He is aware alcohol is also trigger as well as not drinking enough water Plan: Indomethicin 75 mg daily ordered       Kathlene Cote NP [facility]

## 2022-05-02 NOTE — Progress Notes (Signed)
Report was called to [facility]. Pt was transferred to room 230. Pt is alert and oriented x4 pts v/s are stable and pt has no c/o pain or discomfort at this time.

## 2022-05-02 NOTE — Progress Notes (Signed)
PROGRESS NOTE    Chad Brady  [id number] DOB: Mar 01, 1975 DOA: 04/28/2022 PCP: Ida Rogue, MD  230A/[id number]-AA  LOS: 4 days   Brief hospital course:   Assessment & Plan: Chad Brady is a 46 y.o. male who is presenting along with his fiance this afternoon to [facility] ER.  Patient is the primary historian for this encounter.  History is corroborated from the fianc as well.  Patient reports a several year history of using alcohol up to a liter of vodka every day that the patient has been trying "unsuccessfully "to reduce/stop for several years.  Patient reports being on Suboxone for this very reason.  Patient reports having tremors and anxiety every time he reduces alcohol use.  Also reports having dyspepsia like symptoms and vomiting from continued alcohol use.  Regardless patient reports stopping all alcohol use about 4 days ago.  Patient seems to have done well the following day and pretty much slept all day on Thursday and Friday.  However starting Saturday, 2 days ago patient started having tremors, sweating feeling hot and cold, multiple episodes of vomiting and sensation of imbalance with at least 3 falls at home and at his Suboxone clinic.  There is no report of diarrhea or belly pain no recorded fever no rash on his skin no cough no trouble breathing no chest pain no back pain.  Patient has also been having sensation of seeing gnats flying all around the house which are not present as per the fianc.  Also patient has sensation of hearing trains passing which the fianc cannot hear.  Also patient has felt like his fingers are taped.   Alcohol w/drawal:  w/ hallucinations. CT head showed no acute intracranial abnormalities.  --tapered off precedex gtt today.   Plan: --taper off ativan --phenobarbital taper, transition to oral --cont thiamine as oral   Acute alcoholic hepatitis:  liver US showed fatty liver. Hepatitis panel neg.   Hypokalemia:  --monitor and replete PRN    Hypomagnesemia:  --monitor and replete PRN with IV   Hyperglycemia:  Pre-diabetes --A1c 6.3 --d/c'ed BG checks and SSI   Marijuana use:  received drug cessation counseling  HTN --resume home Lisinopril today     DVT prophylaxis: Lovenox SQ Code Status: Full code  Family Communication:  Level of care: Med-Surg Dispo:   The patient is from: home Anticipated d/c is to: home Anticipated d/c date is: tomorrow   Subjective and Interval History:  Precedex gtt off.  Pt reported feeling back to normal.   Objective: Vitals:   05/02/22 1000 05/02/22 1100 05/02/22 1317 05/02/22 1530  BP: (!) 134/92 (!) 142/97 (!) 158/107 (!) 164/101  Pulse:  86 87 96  Resp: 16 19 14 14   Temp:   98.4 F (36.9 C) 98.5 F (36.9 C)  TempSrc:   Oral   SpO2: 95% 97% 100% 100%  Weight:      Height:        Intake/Output Summary (Last 24 hours) at 05/02/2022 1736 Last data filed at 05/02/2022 1609 Gross per 24 hour  Intake 929.08 ml  Output 2450 ml  Net -1520.92 ml   Filed Weights   04/28/22 1801 04/29/22 0517 04/30/22 0341  Weight: 88 kg 133.4 kg 86.9 kg    Examination:   Constitutional: NAD, AAOx3 HEENT: conjunctivae and lids normal, EOMI CV: No cyanosis.   RESP: normal respiratory effort, on RA Neuro: II - XII grossly intact.   Psych: Normal mood and affect.  Appropriate judgement  and reason   Data Reviewed: I have personally reviewed labs and imaging studies  Time spent: 35 minutes  Enzo Bi, MD [facility] If 7PM-7AM, please contact night-coverage 05/02/2022, 5:36 PM

## 2022-05-02 NOTE — Progress Notes (Signed)
Pt received from ICU. He is alert and oriented x4,VSS, denies pain.  CIWA = 0 CCMD notified and verified.   05/02/22 1317  Vitals  Temp 98.4 F (36.9 C)  Temp Source Oral  BP (!) 158/107  MAP (mmHg) 122  BP Location Right Arm  BP Method Automatic  Patient Position (if appropriate) Lying  Pulse Rate 87  Pulse Rate Source Monitor  ECG Heart Rate 88  Resp 14  MEWS COLOR  MEWS Score Color Green  Oxygen Therapy  SpO2 100 %  O2 Device Room Air  Pain Assessment  Pain Scale 0-10  Pain Score 0

## 2022-05-02 NOTE — Progress Notes (Signed)
       CROSS COVER NOTE  NAME: Chad Brady MRN: [id number] DOB : [birth year]    HPI/Events of Note   Report:frequent sinus pause on cardiac monitoring less than 3 sec. BP stable  On review of chart:alcohol withdrawal treatment requiring phenobarbital, precedex and ativan Precedex at 1 mcg/kg/hr    Assessment and  Interventions   Assessment: Patient sedated, VSS EKG  SB without ST T wave abnormalities  Plan: Wean precedex - precedex is associated with side effect of heart block and cardiac arrest       Kathlene Cote NP [facility]

## 2022-05-03 DIAGNOSIS — F10932 Alcohol use, unspecified with withdrawal with perceptual disturbance: Secondary | ICD-10-CM | POA: Diagnosis not present

## 2022-05-03 LAB — CULTURE, BLOOD (ROUTINE X 2)
Culture: NO GROWTH
Culture: NO GROWTH
Special Requests: ADEQUATE
Special Requests: ADEQUATE

## 2022-05-03 LAB — CBC
HCT: 40 % (ref 39.0–52.0)
Hemoglobin: 14.1 g/dL (ref 13.0–17.0)
MCH: 32.6 pg (ref 26.0–34.0)
MCHC: 35.3 g/dL (ref 30.0–36.0)
MCV: 92.6 fL (ref 80.0–100.0)
Platelets: 197 10*3/uL (ref 150–400)
RBC: 4.32 MIL/uL (ref 4.22–5.81)
RDW: 11.7 % (ref 11.5–15.5)
WBC: 10.1 10*3/uL (ref 4.0–10.5)
nRBC: 0 % (ref 0.0–0.2)

## 2022-05-03 LAB — BASIC METABOLIC PANEL
Anion gap: 13 (ref 5–15)
BUN: 13 mg/dL (ref 6–20)
CO2: 26 mmol/L (ref 22–32)
Calcium: 8.8 mg/dL — ABNORMAL LOW (ref 8.9–10.3)
Chloride: 94 mmol/L — ABNORMAL LOW (ref 98–111)
Creatinine, Ser: 0.78 mg/dL (ref 0.61–1.24)
GFR, Estimated: >60 mL/min (ref >60)
Glucose, Bld: 120 mg/dL — ABNORMAL HIGH (ref 70–99)
Potassium: 4.7 mmol/L (ref 3.5–5.1)
Sodium: 133 mmol/L — ABNORMAL LOW (ref 135–145)

## 2022-05-03 LAB — MAGNESIUM: Magnesium: 2.2 mg/dL (ref 1.7–2.4)

## 2022-05-03 MED ORDER — VITAMIN B-1 100 MG PO TABS: 100.0000 mg | ORAL_TABLET | Freq: Every day | ORAL | Status: DC

## 2022-05-03 MED ORDER — ADULT MULTIVITAMIN W/MINERALS CH: 1.0000 | ORAL_TABLET | Freq: Every day | ORAL | Status: DC

## 2022-05-03 MED ORDER — FOLIC ACID 1 MG PO TABS: 1.0000 mg | ORAL_TABLET | Freq: Every day | ORAL | Status: DC

## 2022-05-03 MED ORDER — LISINOPRIL 20 MG PO TABS
20.0000 mg | ORAL_TABLET | Freq: Every day | ORAL | Status: DC
  Administered 2022-05-03: 20 mg via ORAL

## 2022-05-03 NOTE — Discharge Summary (Signed)
Physician Discharge Summary   Chad Brady  male DOB: [birth year]  [id number]  PCP: Ida Rogue, MD  Admit date: 04/28/2022 Discharge date: 05/03/22  Admitted From: home Disposition:  home CODE STATUS: Full code   Hospital Course:  For full details, please see H&P, progress notes, consult notes and ancillary notes.  Briefly,  Chad Brady is a 47 y.o. male who is presenting along with his fiance this afternoon to [facility] ER.    Patient reported a several year history of using alcohol up to a liter of vodka every day that the patient has been trying "unsuccessfully "to reduce/stop for several years.  Patient reports being on Suboxone for this very reason.  Patient reports having tremors and anxiety every time he reduces alcohol use.  Also reports having dyspepsia like symptoms and vomiting from continued alcohol use.  Pt reported stopping all alcohol use about 4 days PTA.  Patient seems to have done well initially, however after 2 days, patient started having tremors, sweating feeling hot and cold, multiple episodes of vomiting and sensation of imbalance with at least 3 falls at home and at his Suboxone clinic.  Patient has also been having audio hallucination.     Alcohol w/drawal:  Hx of alcohol abuse w/ hallucinations. CT head showed no acute intracranial abnormalities.  Due to severe agitation and confusion, pt was started on precedex gtt in addition to ativan per CIWA.  Pt also received phenobarbital with taper.  Mental status did gradually improve, and was at baseline prior to discharge. --complete alcohol cessation strongly advised to pt.   Acute alcoholic hepatitis:  liver US showed fatty liver. Hepatitis panel neg.   Hypokalemia:  --monitored and repleted PRN   Hypomagnesemia:  --monitored and repleted PRN with IV   Hyperglycemia:  Pre-diabetes --A1c 6.3 --d/c'ed BG checks and SSI   Marijuana use:  received drug cessation counseling from admitting  physician   HTN --home Lisinopril held during hospitalization, resumed prior to discharge. --not taking amlodipine PTA.   Unless noted above, medications under "STOP" list are ones pt was not taking PTA.  Discharge Diagnoses:  Principal Problem:   Alcohol withdrawal hallucinosis (HCC) Active Problems:   Alcohol use disorder   Hepatitis   Marijuana use   Alcohol withdrawal (HCC)   Hyperglycemia   Vomiting   30 Day Unplanned Readmission Risk Score    Flowsheet Row ED to Hosp-Admission (Current) from 04/28/2022 in [place]  30 Day Unplanned Readmission Risk Score (%) 12.96 Filed at 05/03/2022 0800       This score is the patient's risk of an unplanned readmission within 30 days of being discharged (0 -100%). The score is based on dignosis, age, lab data, medications, orders, and past utilization.   Low:  0-14.9   Medium: 15-21.9   High: 22-29.9   Extreme: 30 and above         Discharge Instructions:  Allergies as of 05/03/2022   No Known Allergies      Medication List     STOP taking these medications    amLODipine 5 MG tablet Commonly known as: NORVASC   cephALEXin 500 MG capsule Commonly known as: KEFLEX   Fish Oil 1000 MG Cpdr   meloxicam 15 MG tablet Commonly known as: MOBIC   oxyCODONE-acetaminophen 5-325 MG tablet Commonly known as: PERCOCET/ROXICET   sulfamethoxazole-trimethoprim 800-160 MG tablet Commonly known as: BACTRIM DS       TAKE these medications  folic acid 1 MG tablet Commonly known as: FOLVITE Take 1 tablet (1 mg total) by mouth daily. Can take any over-the-counter form.   indomethacin 50 MG capsule Commonly known as: INDOCIN Take 50 mg by mouth 3 (three) times daily as needed (gout flares). What changed: Another medication with the same name was removed. Continue taking this medication, and follow the directions you see here.   lisinopril 20 MG tablet Commonly known as:  ZESTRIL Take 20 mg by mouth daily.   multivitamin with minerals Tabs tablet Take 1 tablet by mouth daily. Can take any over-the-counter form.   Suboxone 8-2 MG Film Generic drug: Buprenorphine HCl-Naloxone HCl Place 1 Film under the tongue 2 (two) times daily.   thiamine 100 MG tablet Commonly known as: Vitamin B-1 Take 1 tablet (100 mg total) by mouth daily. Can take any over-the-counter form.         Follow-up Information     Ida Rogue, MD Follow up in 1 week(s).   Specialties: Internal Medicine, Addiction Medicine Contact information: [place] [address] [phone]                 No Known Allergies   The results of significant diagnostics from this hospitalization (including imaging, microbiology, ancillary and laboratory) are listed below for reference.   Consultations:   Procedures/Studies: US Abdomen Limited RUQ (LIVER/GB)  Result Date: 04/28/2022 CLINICAL DATA:  Hepatitis EXAM: ULTRASOUND ABDOMEN LIMITED RIGHT UPPER QUADRANT COMPARISON:  None Available. FINDINGS: Gallbladder: No gallstones or wall thickening visualized. No sonographic Murphy sign noted by sonographer. Common bile duct: Diameter: 5 mm Liver: Increased echogenicity is noted without focal mass consistent with fatty infiltration. Portal vein is patent on color Doppler imaging with normal direction of blood flow towards the liver. Other: None. IMPRESSION: Fatty liver. Electronically Signed   By: Inez Catalina M.D.   On: 04/28/2022 21:00   CT Head Wo Contrast  Result Date: 04/28/2022 CLINICAL DATA:  Head trauma EXAM: CT HEAD WITHOUT CONTRAST TECHNIQUE: Contiguous axial images were obtained from the base of the skull through the vertex without intravenous contrast. RADIATION DOSE REDUCTION: This exam was performed according to the departmental dose-optimization program which includes automated exposure control, adjustment of the mA and/or kV according to patient size  and/or use of iterative reconstruction technique. COMPARISON:  07/01/2020. FINDINGS: Brain: There is no mass, hemorrhage or extra-axial collection. The size and configuration of the ventricles and extra-axial CSF spaces are normal. The brain parenchyma is normal, without acute or chronic infarction. Vascular: No abnormal hyperdensity of the major intracranial arteries or dural venous sinuses. No intracranial atherosclerosis. Skull: The visualized skull base, calvarium and extracranial soft tissues are normal. Sinuses/Orbits: No fluid levels or advanced mucosal thickening of the visualized paranasal sinuses. No mastoid or middle ear effusion. The orbits are normal. IMPRESSION: Normal head CT. Electronically Signed   By: Ulyses Jarred M.D.   On: 04/28/2022 19:05      Labs: BNP (last 3 results) No results for input(s): "BNP" in the last 8760 hours. Basic Metabolic Panel: Recent Labs  Lab 04/29/22 0558 04/30/22 0659 05/01/22 0503 05/02/22 0413 05/03/22 0342  NA 131* 135 137 133* 133*  K 3.1* 3.4* 4.1 4.5 4.7  CL 92* 101 105 99 94*  CO2 31 26 23 28 26   GLUCOSE 154* 146* 162* 187* 120*  BUN 20 13 11 10 13   CREATININE 0.86 0.67 0.67 0.85 0.78  CALCIUM 8.3* 8.4* 8.7* 8.7* 8.8*  MG  1.3* 1.8 1.7 1.6* 2.2  PHOS  --  4.0  --  4.8*  --    Liver Function Tests: Recent Labs  Lab 04/28/22 1805 04/30/22 0659  AST 66* 95*  ALT 58* 70*  ALKPHOS 64 51  BILITOT 2.4* 1.1  PROT 8.3* 6.1*  ALBUMIN 4.6 3.4*   No results for input(s): "LIPASE", "AMYLASE" in the last 168 hours. No results for input(s): "AMMONIA" in the last 168 hours. CBC: Recent Labs  Lab 04/29/22 0558 04/30/22 0659 05/01/22 0503 05/02/22 0413 05/03/22 0342  WBC 7.5 5.4 5.3 7.8 10.1  HGB 14.8 13.1 14.4 13.7 14.1  HCT 43.3 37.9* 43.0 39.3 40.0  MCV 93.9 93.6 98.2 94.0 92.6  PLT 172 151 144* 155 197   Cardiac Enzymes: Recent Labs  Lab 04/28/22 1805  CKTOTAL 264   BNP: Invalid input(s): "POCBNP" CBG: Recent Labs   Lab 04/29/22 1716 04/29/22 2221 04/30/22 0336 04/30/22 0739 04/30/22 1115  GLUCAP 129* 148* 115* 128* 150*   D-Dimer No results for input(s): "DDIMER" in the last 72 hours. Hgb A1c No results for input(s): "HGBA1C" in the last 72 hours. Lipid Profile No results for input(s): "CHOL", "HDL", "LDLCALC", "TRIG", "CHOLHDL", "LDLDIRECT" in the last 72 hours. Thyroid function studies No results for input(s): "TSH", "T4TOTAL", "T3FREE", "THYROIDAB" in the last 72 hours.  Invalid input(s): "FREET3" Anemia work up No results for input(s): "VITAMINB12", "FOLATE", "FERRITIN", "TIBC", "IRON", "RETICCTPCT" in the last 72 hours. Urinalysis    Component Value Date/Time   COLORURINE AMBER (A) 04/28/2022 1808   APPEARANCEUR HAZY (A) 04/28/2022 1808   LABSPEC 1.028 04/28/2022 1808   PHURINE 5.0 04/28/2022 1808   GLUCOSEU NEGATIVE 04/28/2022 1808   HGBUR SMALL (A) 04/28/2022 1808   BILIRUBINUR NEGATIVE 04/28/2022 1808   KETONESUR NEGATIVE 04/28/2022 1808   PROTEINUR >=300 (A) 04/28/2022 1808   NITRITE NEGATIVE 04/28/2022 1808   LEUKOCYTESUR NEGATIVE 04/28/2022 1808   Sepsis Labs Recent Labs  Lab 04/30/22 0659 05/01/22 0503 05/02/22 0413 05/03/22 0342  WBC 5.4 5.3 7.8 10.1   Microbiology Recent Results (from the past 240 hour(s))  Culture, blood (Routine X 2) w Reflex to ID Panel     Status: None   Collection Time: 04/28/22  9:10 PM   Specimen: BLOOD  Result Value Ref Range Status   Specimen Description BLOOD BLOOD RIGHT ARM  Final   Special Requests   Final    BOTTLES DRAWN AEROBIC AND ANAEROBIC Blood Culture adequate volume   Culture   Final    NO GROWTH 5 DAYS Performed at [facility], [address] [place], [place] [address]    Report Status 05/03/2022 FINAL  Final  Culture, blood (Routine X 2) w Reflex to ID Panel     Status: None   Collection Time: 04/28/22  9:10 PM   Specimen: BLOOD  Result Value Ref Range Status   Specimen Description BLOOD BLOOD LEFT  ARM  Final   Special Requests   Final    BOTTLES DRAWN AEROBIC AND ANAEROBIC Blood Culture adequate volume   Culture   Final    NO GROWTH 5 DAYS Performed at [facility], [address], [place], [place] [address]    Report Status 05/03/2022 FINAL  Final  MRSA Next Gen by PCR, Nasal     Status: None   Collection Time: 04/30/22  3:31 AM   Specimen: Nasal Mucosa; Nasal Swab  Result Value Ref Range Status   MRSA by PCR Next Gen NOT DETECTED NOT  DETECTED Final    Comment: (NOTE) The GeneXpert MRSA Assay (FDA approved for NASAL specimens only), is one component of a comprehensive MRSA colonization surveillance program. It is not intended to diagnose MRSA infection nor to guide or monitor treatment for MRSA infections. Test performance is not FDA approved in patients less than 59 years old. Performed at [facility], [address] [place], [place] [address]      Total time spend on discharging this patient, including the last patient exam, discussing the hospital stay, instructions for ongoing care as it relates to all pertinent caregivers, as well as preparing the medical discharge records, prescriptions, and/or referrals as applicable, is 30 minutes.    Enzo Bi, MD  [facility] 05/03/2022, 8:34 AM

## 2022-10-25 ENCOUNTER — Encounter (HOSPITAL_COMMUNITY): Payer: Self-pay

## 2022-10-25 ENCOUNTER — Emergency Department (HOSPITAL_COMMUNITY): Payer: MEDICAID

## 2022-10-25 ENCOUNTER — Inpatient Hospital Stay (HOSPITAL_COMMUNITY)
Admission: EM | Admit: 2022-10-25 | Discharge: 2022-10-28 | DRG: 964 | Disposition: A | Discharge status: Home / Self Care | Payer: MEDICAID | Attending: Surgery | Admitting: Surgery

## 2022-10-25 ENCOUNTER — Inpatient Hospital Stay (HOSPITAL_COMMUNITY): Payer: MEDICAID

## 2022-10-25 ENCOUNTER — Other Ambulatory Visit: Payer: Self-pay

## 2022-10-25 DIAGNOSIS — G9520 Unspecified cord compression: Secondary | ICD-10-CM | POA: Diagnosis present

## 2022-10-25 DIAGNOSIS — R7401 Elevation of levels of liver transaminase levels: Secondary | ICD-10-CM | POA: Diagnosis present

## 2022-10-25 DIAGNOSIS — Z87820 Personal history of traumatic brain injury: Secondary | ICD-10-CM | POA: Diagnosis not present

## 2022-10-25 DIAGNOSIS — S2249XA Multiple fractures of ribs, unspecified side, initial encounter for closed fracture: Secondary | ICD-10-CM | POA: Diagnosis present

## 2022-10-25 DIAGNOSIS — Z79899 Other long term (current) drug therapy: Secondary | ICD-10-CM | POA: Diagnosis not present

## 2022-10-25 DIAGNOSIS — E785 Hyperlipidemia, unspecified: Secondary | ICD-10-CM | POA: Diagnosis present

## 2022-10-25 DIAGNOSIS — F10920 Alcohol use, unspecified with intoxication, uncomplicated: Secondary | ICD-10-CM

## 2022-10-25 DIAGNOSIS — F17210 Nicotine dependence, cigarettes, uncomplicated: Secondary | ICD-10-CM | POA: Diagnosis present

## 2022-10-25 DIAGNOSIS — Z5900 Homelessness unspecified: Secondary | ICD-10-CM | POA: Diagnosis not present

## 2022-10-25 DIAGNOSIS — S32029A Unspecified fracture of second lumbar vertebra, initial encounter for closed fracture: Secondary | ICD-10-CM | POA: Diagnosis present

## 2022-10-25 DIAGNOSIS — F10120 Alcohol abuse with intoxication, uncomplicated: Secondary | ICD-10-CM | POA: Diagnosis present

## 2022-10-25 DIAGNOSIS — Y92410 Unspecified street and highway as the place of occurrence of the external cause: Secondary | ICD-10-CM | POA: Diagnosis not present

## 2022-10-25 DIAGNOSIS — S32019A Unspecified fracture of first lumbar vertebra, initial encounter for closed fracture: Secondary | ICD-10-CM | POA: Diagnosis present

## 2022-10-25 DIAGNOSIS — M4802 Spinal stenosis, cervical region: Secondary | ICD-10-CM | POA: Diagnosis present

## 2022-10-25 DIAGNOSIS — M109 Gout, unspecified: Secondary | ICD-10-CM | POA: Diagnosis present

## 2022-10-25 DIAGNOSIS — I609 Nontraumatic subarachnoid hemorrhage, unspecified: Secondary | ICD-10-CM

## 2022-10-25 DIAGNOSIS — M1990 Unspecified osteoarthritis, unspecified site: Secondary | ICD-10-CM | POA: Diagnosis present

## 2022-10-25 DIAGNOSIS — I10 Essential (primary) hypertension: Secondary | ICD-10-CM | POA: Diagnosis present

## 2022-10-25 DIAGNOSIS — Y908 Blood alcohol level of 240 mg/100 ml or more: Secondary | ICD-10-CM | POA: Diagnosis present

## 2022-10-25 DIAGNOSIS — S066XAA Traumatic subarachnoid hemorrhage with loss of consciousness status unknown, initial encounter: Secondary | ICD-10-CM | POA: Diagnosis present

## 2022-10-25 DIAGNOSIS — Z811 Family history of alcohol abuse and dependence: Secondary | ICD-10-CM | POA: Diagnosis not present

## 2022-10-25 DIAGNOSIS — S37812A Contusion of adrenal gland, initial encounter: Secondary | ICD-10-CM | POA: Diagnosis present

## 2022-10-25 DIAGNOSIS — S32039A Unspecified fracture of third lumbar vertebra, initial encounter for closed fracture: Secondary | ICD-10-CM | POA: Diagnosis present

## 2022-10-25 DIAGNOSIS — S2242XA Multiple fractures of ribs, left side, initial encounter for closed fracture: Principal | ICD-10-CM | POA: Diagnosis present

## 2022-10-25 DIAGNOSIS — Z8249 Family history of ischemic heart disease and other diseases of the circulatory system: Secondary | ICD-10-CM

## 2022-10-25 DIAGNOSIS — M50223 Other cervical disc displacement at C6-C7 level: Secondary | ICD-10-CM | POA: Diagnosis present

## 2022-10-25 DIAGNOSIS — S270XXA Traumatic pneumothorax, initial encounter: Secondary | ICD-10-CM | POA: Diagnosis present

## 2022-10-25 DIAGNOSIS — R0781 Pleurodynia: Secondary | ICD-10-CM | POA: Diagnosis present

## 2022-10-25 DIAGNOSIS — S2243XA Multiple fractures of ribs, bilateral, initial encounter for closed fracture: Secondary | ICD-10-CM | POA: Diagnosis present

## 2022-10-25 DIAGNOSIS — S32009A Unspecified fracture of unspecified lumbar vertebra, initial encounter for closed fracture: Secondary | ICD-10-CM

## 2022-10-25 DIAGNOSIS — T148XXA Other injury of unspecified body region, initial encounter: Secondary | ICD-10-CM

## 2022-10-25 LAB — URINALYSIS, ROUTINE W REFLEX MICROSCOPIC
Bacteria, UA: NONE SEEN
Bilirubin Urine: NEGATIVE
Glucose, UA: 50 mg/dL — AB
Ketones, ur: NEGATIVE mg/dL
Leukocytes,Ua: NEGATIVE
Nitrite: NEGATIVE
Protein, ur: NEGATIVE mg/dL
Specific Gravity, Urine: 1.041 — ABNORMAL HIGH (ref 1.005–1.030)
pH: 5 (ref 5.0–8.0)

## 2022-10-25 LAB — COMPREHENSIVE METABOLIC PANEL
ALT: 63 U/L — ABNORMAL HIGH (ref 0–44)
ALT: 59 U/L — ABNORMAL HIGH (ref 0–44)
AST: 87 U/L — ABNORMAL HIGH (ref 15–41)
AST: 74 U/L — ABNORMAL HIGH (ref 15–41)
Albumin: 3.5 g/dL (ref 3.5–5.0)
Albumin: 3.4 g/dL — ABNORMAL LOW (ref 3.5–5.0)
Alkaline Phosphatase: 65 U/L (ref 38–126)
Alkaline Phosphatase: 62 U/L (ref 38–126)
Anion gap: 14 (ref 5–15)
Anion gap: 14 (ref 5–15)
BUN: 7 mg/dL (ref 6–20)
BUN: 5 mg/dL — ABNORMAL LOW (ref 6–20)
CO2: 22 mmol/L (ref 22–32)
CO2: 22 mmol/L (ref 22–32)
Calcium: 8.4 mg/dL — ABNORMAL LOW (ref 8.9–10.3)
Calcium: 7.9 mg/dL — ABNORMAL LOW (ref 8.9–10.3)
Chloride: 105 mmol/L (ref 98–111)
Chloride: 102 mmol/L (ref 98–111)
Creatinine, Ser: 0.83 mg/dL (ref 0.61–1.24)
Creatinine, Ser: 0.68 mg/dL (ref 0.61–1.24)
GFR, Estimated: >60 mL/min (ref >60)
GFR, Estimated: >60 mL/min (ref >60)
Glucose, Bld: 161 mg/dL — ABNORMAL HIGH (ref 70–99)
Glucose, Bld: 155 mg/dL — ABNORMAL HIGH (ref 70–99)
Potassium: 3.5 mmol/L (ref 3.5–5.1)
Potassium: 3.4 mmol/L — ABNORMAL LOW (ref 3.5–5.1)
Sodium: 141 mmol/L (ref 135–145)
Sodium: 138 mmol/L (ref 135–145)
Total Bilirubin: 0.6 mg/dL (ref 0.3–1.2)
Total Bilirubin: 0.9 mg/dL (ref 0.3–1.2)
Total Protein: 6.4 g/dL — ABNORMAL LOW (ref 6.5–8.1)
Total Protein: 6.3 g/dL — ABNORMAL LOW (ref 6.5–8.1)

## 2022-10-25 LAB — PHOSPHORUS: Phosphorus: 3.6 mg/dL (ref 2.5–4.6)

## 2022-10-25 LAB — I-STAT CHEM 8, ED
BUN: 7 mg/dL (ref 6–20)
Calcium, Ion: 1.03 mmol/L — ABNORMAL LOW (ref 1.15–1.40)
Chloride: 105 mmol/L (ref 98–111)
Creatinine, Ser: 1.1 mg/dL (ref 0.61–1.24)
Glucose, Bld: 157 mg/dL — ABNORMAL HIGH (ref 70–99)
HCT: 38 % — ABNORMAL LOW (ref 39.0–52.0)
Hemoglobin: 12.9 g/dL — ABNORMAL LOW (ref 13.0–17.0)
Potassium: 3.6 mmol/L (ref 3.5–5.1)
Sodium: 144 mmol/L (ref 135–145)
TCO2: 24 mmol/L (ref 22–32)

## 2022-10-25 LAB — CBC
HCT: 37.3 % — ABNORMAL LOW (ref 39.0–52.0)
Hemoglobin: 12.4 g/dL — ABNORMAL LOW (ref 13.0–17.0)
MCH: 32.4 pg (ref 26.0–34.0)
MCHC: 33.2 g/dL (ref 30.0–36.0)
MCV: 97.4 fL (ref 80.0–100.0)
Platelets: 384 10*3/uL (ref 150–400)
RBC: 3.83 MIL/uL — ABNORMAL LOW (ref 4.22–5.81)
RDW: 12.4 % (ref 11.5–15.5)
WBC: 9.2 10*3/uL (ref 4.0–10.5)
nRBC: 0 % (ref 0.0–0.2)

## 2022-10-25 LAB — RAPID URINE DRUG SCREEN, HOSP PERFORMED
Amphetamines: NOT DETECTED
Barbiturates: NOT DETECTED
Benzodiazepines: NOT DETECTED
Cocaine: NOT DETECTED
Opiates: NOT DETECTED
Tetrahydrocannabinol: POSITIVE — AB

## 2022-10-25 LAB — PROTIME-INR
INR: 1.1 (ref 0.8–1.2)
Prothrombin Time: 14 seconds (ref 11.4–15.2)

## 2022-10-25 LAB — MAGNESIUM: Magnesium: 1.1 mg/dL — ABNORMAL LOW (ref 1.7–2.4)

## 2022-10-25 LAB — SAMPLE TO BLOOD BANK

## 2022-10-25 LAB — I-STAT CG4 LACTIC ACID, ED: Lactic Acid, Venous: 2.5 mmol/L (ref 0.5–1.9)

## 2022-10-25 LAB — ETHANOL: Alcohol, Ethyl (B): 331 mg/dL (ref <10)

## 2022-10-25 MED ORDER — METHOCARBAMOL 1000 MG/10ML IJ SOLN
500.0000 mg | Freq: Three times a day (TID) | INTRAVENOUS | Status: DC
  Filled 2022-10-25: qty 5

## 2022-10-25 MED ORDER — DOCUSATE SODIUM 100 MG PO CAPS
100.0000 mg | ORAL_CAPSULE | Freq: Two times a day (BID) | ORAL | Status: DC
  Administered 2022-10-25 – 2022-10-28 (×6): 100 mg via ORAL
  Filled 2022-10-25 (×6): qty 1

## 2022-10-25 MED ORDER — OXYCODONE HCL 5 MG PO TABS
10.0000 mg | ORAL_TABLET | ORAL | Status: DC | PRN
  Administered 2022-10-25 – 2022-10-26 (×2): 10 mg via ORAL
  Filled 2022-10-25 (×2): qty 2

## 2022-10-25 MED ORDER — ONDANSETRON HCL 4 MG/2ML IJ SOLN: 4.0000 mg | Freq: Four times a day (QID) | INTRAMUSCULAR | Status: DC | PRN

## 2022-10-25 MED ORDER — POLYETHYLENE GLYCOL 3350 17 G PO PACK
17.0000 g | PACK | Freq: Every day | ORAL | Status: DC | PRN
  Administered 2022-10-28: 17 g via ORAL
  Filled 2022-10-25: qty 1

## 2022-10-25 MED ORDER — DOCUSATE SODIUM 100 MG PO CAPS: 100.0000 mg | ORAL_CAPSULE | Freq: Two times a day (BID) | ORAL | Status: DC

## 2022-10-25 MED ORDER — OXYCODONE HCL 5 MG PO TABS: 5.0000 mg | ORAL_TABLET | ORAL | Status: DC | PRN

## 2022-10-25 MED ORDER — THIAMINE HCL 100 MG/ML IJ SOLN
100.0000 mg | Freq: Every day | INTRAMUSCULAR | Status: DC
  Filled 2022-10-25: qty 2

## 2022-10-25 MED ORDER — ADULT MULTIVITAMIN W/MINERALS CH
1.0000 | ORAL_TABLET | Freq: Every day | ORAL | Status: DC
  Administered 2022-10-25 – 2022-10-28 (×4): 1 via ORAL
  Filled 2022-10-25 (×4): qty 1

## 2022-10-25 MED ORDER — METHOCARBAMOL 500 MG PO TABS
500.0000 mg | ORAL_TABLET | Freq: Three times a day (TID) | ORAL | Status: DC
  Administered 2022-10-25 – 2022-10-26 (×2): 500 mg via ORAL
  Filled 2022-10-25 (×2): qty 1

## 2022-10-25 MED ORDER — PROCHLORPERAZINE EDISYLATE 10 MG/2ML IJ SOLN: 10.0000 mg | INTRAMUSCULAR | Status: DC | PRN

## 2022-10-25 MED ORDER — SIMETHICONE 80 MG PO CHEW: 80.0000 mg | CHEWABLE_TABLET | Freq: Four times a day (QID) | ORAL | Status: DC | PRN

## 2022-10-25 MED ORDER — HYDROMORPHONE HCL 1 MG/ML IJ SOLN
1.0000 mg | Freq: Once | INTRAMUSCULAR | Status: AC
  Administered 2022-10-25: 1 mg via INTRAVENOUS
  Filled 2022-10-25: qty 1

## 2022-10-25 MED ORDER — LORAZEPAM 2 MG/ML IJ SOLN
1.0000 mg | INTRAMUSCULAR | Status: DC | PRN
  Administered 2022-10-25 – 2022-10-26 (×2): 2 mg via INTRAVENOUS
  Filled 2022-10-25 (×2): qty 1

## 2022-10-25 MED ORDER — IOHEXOL 350 MG/ML SOLN
75.0000 mL | Freq: Once | INTRAVENOUS | Status: AC | PRN
  Administered 2022-10-25: 75 mL via INTRAVENOUS

## 2022-10-25 MED ORDER — THIAMINE MONONITRATE 100 MG PO TABS
100.0000 mg | ORAL_TABLET | Freq: Every day | ORAL | Status: DC
  Administered 2022-10-25 – 2022-10-28 (×4): 100 mg via ORAL
  Filled 2022-10-25 (×4): qty 1

## 2022-10-25 MED ORDER — FOLIC ACID 1 MG PO TABS
1.0000 mg | ORAL_TABLET | Freq: Every day | ORAL | Status: DC
  Administered 2022-10-25 – 2022-10-28 (×4): 1 mg via ORAL
  Filled 2022-10-25 (×4): qty 1

## 2022-10-25 MED ORDER — HYDROMORPHONE HCL 1 MG/ML IJ SOLN
1.0000 mg | INTRAMUSCULAR | Status: DC | PRN
  Administered 2022-10-25 – 2022-10-27 (×5): 1 mg via INTRAVENOUS
  Filled 2022-10-25 (×6): qty 1

## 2022-10-25 MED ORDER — HYDRALAZINE HCL 20 MG/ML IJ SOLN: 10.0000 mg | INTRAMUSCULAR | Status: DC | PRN

## 2022-10-25 MED ORDER — LORAZEPAM 1 MG PO TABS: 1.0000 mg | ORAL_TABLET | ORAL | Status: DC | PRN

## 2022-10-25 MED ORDER — METOPROLOL TARTRATE 5 MG/5ML IV SOLN: 5.0000 mg | Freq: Four times a day (QID) | INTRAVENOUS | Status: DC | PRN

## 2022-10-25 MED ORDER — LACTATED RINGERS IV SOLN: INTRAVENOUS | Status: DC

## 2022-10-25 MED ORDER — METHOCARBAMOL 1000 MG/10ML IJ SOLN: 500.0000 mg | Freq: Four times a day (QID) | INTRAVENOUS | Status: DC | PRN

## 2022-10-25 MED ORDER — METOCLOPRAMIDE HCL 5 MG/ML IJ SOLN
10.0000 mg | Freq: Four times a day (QID) | INTRAMUSCULAR | Status: DC
  Administered 2022-10-25 – 2022-10-28 (×9): 10 mg via INTRAVENOUS
  Filled 2022-10-25 (×9): qty 2

## 2022-10-25 MED ORDER — GABAPENTIN 300 MG PO CAPS
300.0000 mg | ORAL_CAPSULE | Freq: Three times a day (TID) | ORAL | Status: DC
  Administered 2022-10-25: 300 mg via ORAL
  Filled 2022-10-25: qty 1

## 2022-10-25 MED ORDER — ACETAMINOPHEN 500 MG PO TABS
1000.0000 mg | ORAL_TABLET | Freq: Four times a day (QID) | ORAL | Status: DC
  Administered 2022-10-25 – 2022-10-28 (×10): 1000 mg via ORAL
  Filled 2022-10-25 (×8): qty 2

## 2022-10-25 MED ORDER — LACTATED RINGERS IV BOLUS
1000.0000 mL | Freq: Once | INTRAVENOUS | Status: AC
  Administered 2022-10-25: 1000 mL via INTRAVENOUS

## 2022-10-25 MED ORDER — ONDANSETRON 4 MG PO TBDP: 4.0000 mg | ORAL_TABLET | Freq: Four times a day (QID) | ORAL | Status: DC | PRN

## 2022-10-25 MED ORDER — ACETAMINOPHEN 325 MG PO TABS
650.0000 mg | ORAL_TABLET | Freq: Four times a day (QID) | ORAL | Status: DC
Start: 2022-10-25 — End: 2022-10-25
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
## 2022-10-25 NOTE — ED Provider Notes (Signed)
3:58 PM Care assumed from Dr. Jearld Fenton.  At time of transfer of care, patient is awaiting for admission to trauma but also has MRI of the C-spine ordered due to possible ligamentous injury.  Anticipate admission by trauma as previous team planned.    Chad Brady, Chad Brim, MD 10/25/22 2023
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
## 2022-10-25 NOTE — ED Notes (Signed)
Ethanol   331-called as critical from lab
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
## 2022-10-26 ENCOUNTER — Inpatient Hospital Stay (HOSPITAL_COMMUNITY): Payer: MEDICAID

## 2022-10-26 LAB — CBC
HCT: 33.8 % — ABNORMAL LOW (ref 39.0–52.0)
Hemoglobin: 11.5 g/dL — ABNORMAL LOW (ref 13.0–17.0)
MCH: 32.3 pg (ref 26.0–34.0)
MCHC: 34 g/dL (ref 30.0–36.0)
MCV: 94.9 fL (ref 80.0–100.0)
Platelets: 306 10*3/uL (ref 150–400)
RBC: 3.56 MIL/uL — ABNORMAL LOW (ref 4.22–5.81)
RDW: 12.1 % (ref 11.5–15.5)
WBC: 10.1 10*3/uL (ref 4.0–10.5)
nRBC: 0 % (ref 0.0–0.2)

## 2022-10-26 LAB — BASIC METABOLIC PANEL
Anion gap: 13 (ref 5–15)
BUN: <5 mg/dL — ABNORMAL LOW (ref 6–20)
CO2: 26 mmol/L (ref 22–32)
Calcium: 8.3 mg/dL — ABNORMAL LOW (ref 8.9–10.3)
Chloride: 99 mmol/L (ref 98–111)
Creatinine, Ser: 0.65 mg/dL (ref 0.61–1.24)
GFR, Estimated: >60 mL/min (ref >60)
Glucose, Bld: 130 mg/dL — ABNORMAL HIGH (ref 70–99)
Potassium: 3.4 mmol/L — ABNORMAL LOW (ref 3.5–5.1)
Sodium: 138 mmol/L (ref 135–145)

## 2022-10-26 MED ORDER — NICOTINE 14 MG/24HR TD PT24
14.0000 mg | MEDICATED_PATCH | Freq: Every day | TRANSDERMAL | Status: DC
  Administered 2022-10-26 – 2022-10-28 (×3): 14 mg via TRANSDERMAL
  Filled 2022-10-26 (×3): qty 1

## 2022-10-26 MED ORDER — OXYCODONE HCL 5 MG PO TABS
5.0000 mg | ORAL_TABLET | ORAL | Status: DC | PRN
  Administered 2022-10-26 – 2022-10-28 (×4): 10 mg via ORAL
  Filled 2022-10-26 (×4): qty 2

## 2022-10-26 MED ORDER — LISINOPRIL 20 MG PO TABS
20.0000 mg | ORAL_TABLET | Freq: Every day | ORAL | Status: DC
  Administered 2022-10-26 – 2022-10-28 (×3): 20 mg via ORAL
  Filled 2022-10-26 (×3): qty 1

## 2022-10-26 MED ORDER — METHOCARBAMOL 500 MG PO TABS
1000.0000 mg | ORAL_TABLET | Freq: Three times a day (TID) | ORAL | Status: DC
  Administered 2022-10-26 – 2022-10-28 (×10): 1000 mg via ORAL
  Filled 2022-10-26 (×10): qty 2

## 2022-10-26 MED ORDER — METHOCARBAMOL 1000 MG/10ML IJ SOLN
500.0000 mg | Freq: Three times a day (TID) | INTRAVENOUS | Status: DC
  Filled 2022-10-26: qty 5

## 2022-10-26 MED ORDER — IBUPROFEN 400 MG PO TABS
600.0000 mg | ORAL_TABLET | Freq: Three times a day (TID) | ORAL | Status: DC
  Administered 2022-10-26 – 2022-10-28 (×8): 600 mg via ORAL
  Filled 2022-10-26 (×8): qty 2
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
## 2022-10-27 LAB — BASIC METABOLIC PANEL
Anion gap: 10 (ref 5–15)
BUN: 12 mg/dL (ref 6–20)
CO2: 25 mmol/L (ref 22–32)
Calcium: 8.9 mg/dL (ref 8.9–10.3)
Chloride: 99 mmol/L (ref 98–111)
Creatinine, Ser: 0.9 mg/dL (ref 0.61–1.24)
GFR, Estimated: >60 mL/min (ref >60)
Glucose, Bld: 154 mg/dL — ABNORMAL HIGH (ref 70–99)
Potassium: 4.1 mmol/L (ref 3.5–5.1)
Sodium: 134 mmol/L — ABNORMAL LOW (ref 135–145)

## 2022-10-27 LAB — CBC
HCT: 35.9 % — ABNORMAL LOW (ref 39.0–52.0)
Hemoglobin: 12.3 g/dL — ABNORMAL LOW (ref 13.0–17.0)
MCH: 32.9 pg (ref 26.0–34.0)
MCHC: 34.3 g/dL (ref 30.0–36.0)
MCV: 96 fL (ref 80.0–100.0)
Platelets: 253 10*3/uL (ref 150–400)
RBC: 3.74 MIL/uL — ABNORMAL LOW (ref 4.22–5.81)
RDW: 11.9 % (ref 11.5–15.5)
WBC: 11.4 10*3/uL — ABNORMAL HIGH (ref 4.0–10.5)
nRBC: 0 % (ref 0.0–0.2)

## 2022-10-27 MED ORDER — POTASSIUM CHLORIDE CRYS ER 20 MEQ PO TBCR
40.0000 meq | EXTENDED_RELEASE_TABLET | Freq: Two times a day (BID) | ORAL | Status: AC
  Administered 2022-10-27 (×2): 40 meq via ORAL
  Filled 2022-10-27 (×2): qty 2
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
## 2022-10-28 ENCOUNTER — Other Ambulatory Visit (HOSPITAL_COMMUNITY): Payer: Self-pay

## 2022-10-28 LAB — CBC
HCT: 34.7 % — ABNORMAL LOW (ref 39.0–52.0)
Hemoglobin: 11.8 g/dL — ABNORMAL LOW (ref 13.0–17.0)
MCH: 32.4 pg (ref 26.0–34.0)
MCHC: 34 g/dL (ref 30.0–36.0)
MCV: 95.3 fL (ref 80.0–100.0)
Platelets: 252 10*3/uL (ref 150–400)
RBC: 3.64 MIL/uL — ABNORMAL LOW (ref 4.22–5.81)
RDW: 11.8 % (ref 11.5–15.5)
WBC: 11 10*3/uL — ABNORMAL HIGH (ref 4.0–10.5)
nRBC: 0 % (ref 0.0–0.2)

## 2022-10-28 LAB — BASIC METABOLIC PANEL
Anion gap: 8 (ref 5–15)
BUN: 13 mg/dL (ref 6–20)
CO2: 26 mmol/L (ref 22–32)
Calcium: 8.7 mg/dL — ABNORMAL LOW (ref 8.9–10.3)
Chloride: 101 mmol/L (ref 98–111)
Creatinine, Ser: 0.88 mg/dL (ref 0.61–1.24)
GFR, Estimated: >60 mL/min (ref >60)
Glucose, Bld: 118 mg/dL — ABNORMAL HIGH (ref 70–99)
Potassium: 5 mmol/L (ref 3.5–5.1)
Sodium: 135 mmol/L (ref 135–145)

## 2022-10-28 MED ORDER — ACETAMINOPHEN 500 MG PO TABS: 1000.0000 mg | ORAL_TABLET | Freq: Four times a day (QID) | ORAL | Status: AC | PRN

## 2022-10-28 MED ORDER — OXYCODONE HCL 5 MG PO TABS
5.0000 mg | ORAL_TABLET | ORAL | 0 refills | Status: DC | PRN
  Filled 2022-10-28: qty 20, 4d supply, fill #0

## 2022-10-28 MED ORDER — IBUPROFEN 600 MG PO TABS
600.0000 mg | ORAL_TABLET | Freq: Three times a day (TID) | ORAL | 0 refills | Status: DC | PRN
  Filled 2022-10-28: qty 30, 10d supply, fill #0

## 2022-10-28 MED ORDER — METHOCARBAMOL 500 MG PO TABS
500.0000 mg | ORAL_TABLET | Freq: Three times a day (TID) | ORAL | 0 refills | Status: DC | PRN
  Filled 2022-10-28: qty 50, 9d supply, fill #0

## 2022-10-28 NOTE — Progress Notes (Addendum)
D/c tele and ivs. Went over AVS with pt and all question were addressed. Pt received meds from TOC.   Lawson Radar, RN
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
## 2022-10-28 NOTE — Progress Notes (Signed)
Mobility Specialist Progress Note:   10/28/22 1443  Mobility  Activity Ambulated with assistance in hallway  Level of Assistance Independent after set-up  Assistive Device None  Distance Ambulated (ft) 475 ft  Activity Response Tolerated well  Mobility Referral Yes  $Mobility charge 1 Mobility  Mobility Specialist Start Time (ACUTE ONLY) 1432  Mobility Specialist Stop Time (ACUTE ONLY) 1440  Mobility Specialist Time Calculation (min) (ACUTE ONLY) 8 min   Pre Mobility: 99 HR  During Mobility: 101 HR  Post Mobility: 104 HR   Pt received in chair, agreeable to mobility. C/o slight rib pain, otherwise asymptomatic throughout. Pt returned to chair with call bell in reach and all needs met.   Leory Plowman  Mobility Specialist Please contact via [employer] office at [phone]
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
## 2022-10-28 NOTE — TOC Transition Note (Signed)
Transition of Care [facility]) - CM/SW Discharge Note   Patient Details  Name: Chad Brady MRN: [id number] Date of Birth: 07/27/1975  Transition [facility]) CM/SW Contact:  Lawerance Sabal, RN Phone Number: 10/28/2022, 10:40 AM   Clinical Narrative:     Spoke to patient at bedside.  He states that his friend Raynelle Fanning will pick him up at the hospital to take him to [place] after 5pm today.  He states that he is homeless, I have provided him resources.  We discussed SLP referral, and it has been placed to [facility].  PCP listed, meds sent through [facility] pharmacy.     Barriers to Discharge: Continued Medical Work up   Patient Goals and CMS Choice      Discharge Placement                         Discharge Plan and Services Additional resources added to the After Visit Summary for     Discharge Planning Services: CM Consult                                 Social Determinants of Health (SDOH) Interventions SDOH Screenings   Food Insecurity: No Food Insecurity (04/29/2022)  Housing: Low Risk  (04/29/2022)  Transportation Needs: No Transportation Needs (04/29/2022)  Utilities: Not At Risk (04/29/2022)  Tobacco Use: High Risk (10/25/2022)     Readmission Risk Interventions     No data to display

## 2022-12-31 ENCOUNTER — Other Ambulatory Visit: Payer: Self-pay

## 2023-05-10 ENCOUNTER — Emergency Department
Admission: EM | Admit: 2023-05-10 | Discharge: 2023-05-10 | Disposition: A | Discharge status: Home / Self Care | Payer: MEDICAID | Attending: Emergency Medicine | Admitting: Emergency Medicine

## 2023-05-10 ENCOUNTER — Other Ambulatory Visit: Payer: Self-pay

## 2023-05-10 DIAGNOSIS — R739 Hyperglycemia, unspecified: Secondary | ICD-10-CM | POA: Diagnosis not present

## 2023-05-10 DIAGNOSIS — I10 Essential (primary) hypertension: Secondary | ICD-10-CM | POA: Diagnosis not present

## 2023-05-10 DIAGNOSIS — F10988 Alcohol use, unspecified with other alcohol-induced disorder: Secondary | ICD-10-CM | POA: Diagnosis not present

## 2023-05-10 DIAGNOSIS — E8729 Other acidosis: Secondary | ICD-10-CM

## 2023-05-10 DIAGNOSIS — R112 Nausea with vomiting, unspecified: Secondary | ICD-10-CM | POA: Diagnosis present

## 2023-05-10 DIAGNOSIS — E872 Acidosis, unspecified: Secondary | ICD-10-CM | POA: Diagnosis not present

## 2023-05-10 DIAGNOSIS — R197 Diarrhea, unspecified: Secondary | ICD-10-CM | POA: Diagnosis not present

## 2023-05-10 LAB — LIPASE, BLOOD: Lipase: 29 U/L (ref 11–51)

## 2023-05-10 LAB — COMPREHENSIVE METABOLIC PANEL WITH GFR
ALT: 53 U/L — ABNORMAL HIGH (ref 0–44)
AST: 67 U/L — ABNORMAL HIGH (ref 15–41)
Albumin: 4.3 g/dL (ref 3.5–5.0)
Alkaline Phosphatase: 68 U/L (ref 38–126)
Anion gap: 28 — ABNORMAL HIGH (ref 5–15)
BUN: 63 mg/dL — ABNORMAL HIGH (ref 6–20)
CO2: 15 mmol/L — ABNORMAL LOW (ref 22–32)
Calcium: 8.5 mg/dL — ABNORMAL LOW (ref 8.9–10.3)
Chloride: 87 mmol/L — ABNORMAL LOW (ref 98–111)
Creatinine, Ser: 2.33 mg/dL — ABNORMAL HIGH (ref 0.61–1.24)
GFR, Estimated: 34 mL/min — ABNORMAL LOW (ref >60)
Glucose, Bld: 317 mg/dL — ABNORMAL HIGH (ref 70–99)
Potassium: 4.1 mmol/L (ref 3.5–5.1)
Sodium: 130 mmol/L — ABNORMAL LOW (ref 135–145)
Total Bilirubin: 1.8 mg/dL — ABNORMAL HIGH (ref 0.0–1.2)
Total Protein: 7.6 g/dL (ref 6.5–8.1)

## 2023-05-10 LAB — URINALYSIS, ROUTINE W REFLEX MICROSCOPIC
Bacteria, UA: NONE SEEN
Bilirubin Urine: NEGATIVE
Glucose, UA: >=500 mg/dL — AB
Ketones, ur: 20 mg/dL — AB
Leukocytes,Ua: NEGATIVE
Nitrite: NEGATIVE
Protein, ur: 30 mg/dL — AB
RBC / HPF: 0 RBC/hpf (ref 0–5)
Specific Gravity, Urine: 1.01 (ref 1.005–1.030)
Squamous Epithelial / HPF: 0 /HPF (ref 0–5)
pH: 5 (ref 5.0–8.0)

## 2023-05-10 LAB — CBC
HCT: 45.4 % (ref 39.0–52.0)
Hemoglobin: 15.8 g/dL (ref 13.0–17.0)
MCH: 33.8 pg (ref 26.0–34.0)
MCHC: 34.8 g/dL (ref 30.0–36.0)
MCV: 97 fL (ref 80.0–100.0)
Platelets: 270 10*3/uL (ref 150–400)
RBC: 4.68 MIL/uL (ref 4.22–5.81)
RDW: 12.7 % (ref 11.5–15.5)
WBC: 9.8 10*3/uL (ref 4.0–10.5)
nRBC: 0 % (ref 0.0–0.2)

## 2023-05-10 LAB — BLOOD GAS, VENOUS
Acid-base deficit: 6.5 mmol/L — ABNORMAL HIGH (ref 0.0–2.0)
Bicarbonate: 19.7 mmol/L — ABNORMAL LOW (ref 20.0–28.0)
O2 Saturation: 72.5 %
Patient temperature: 37
pCO2, Ven: 41 mmHg — ABNORMAL LOW (ref 44–60)
pH, Ven: 7.29 (ref 7.25–7.43)
pO2, Ven: 44 mmHg (ref 32–45)

## 2023-05-10 LAB — BASIC METABOLIC PANEL WITH GFR
Anion gap: 18 — ABNORMAL HIGH (ref 5–15)
BUN: 63 mg/dL — ABNORMAL HIGH (ref 6–20)
CO2: 19 mmol/L — ABNORMAL LOW (ref 22–32)
Calcium: 7.9 mg/dL — ABNORMAL LOW (ref 8.9–10.3)
Chloride: 96 mmol/L — ABNORMAL LOW (ref 98–111)
Creatinine, Ser: 1.92 mg/dL — ABNORMAL HIGH (ref 0.61–1.24)
GFR, Estimated: 42 mL/min — ABNORMAL LOW (ref >60)
Glucose, Bld: 207 mg/dL — ABNORMAL HIGH (ref 70–99)
Potassium: 4.4 mmol/L (ref 3.5–5.1)
Sodium: 133 mmol/L — ABNORMAL LOW (ref 135–145)

## 2023-05-10 MED ORDER — SODIUM CHLORIDE 0.9 % IV BOLUS
1000.0000 mL | Freq: Once | INTRAVENOUS | Status: AC
  Administered 2023-05-10: 1000 mL via INTRAVENOUS

## 2023-05-10 MED ORDER — ONDANSETRON HCL 4 MG/2ML IJ SOLN
4.0000 mg | Freq: Once | INTRAMUSCULAR | Status: AC
  Administered 2023-05-10: 4 mg via INTRAVENOUS
  Filled 2023-05-10: qty 2

## 2023-05-10 MED ORDER — PANTOPRAZOLE SODIUM 40 MG IV SOLR
80.0000 mg | Freq: Once | INTRAVENOUS | Status: AC
  Administered 2023-05-10: 80 mg via INTRAVENOUS
  Filled 2023-05-10: qty 20

## 2023-05-10 MED ORDER — METFORMIN HCL 500 MG PO TABS: 500.0000 mg | ORAL_TABLET | Freq: Two times a day (BID) | ORAL | 2 refills | Status: DC

## 2023-05-10 MED ORDER — ONDANSETRON 4 MG PO TBDP: 4.0000 mg | ORAL_TABLET | Freq: Three times a day (TID) | ORAL | 0 refills | Status: AC | PRN

## 2023-05-10 NOTE — ED Triage Notes (Signed)
 Pt comes with c/o N/V and belly pain. Pt states he was throwing up blood. Pt stats this started last night. Pt not on thinners.

## 2023-05-10 NOTE — ED Triage Notes (Signed)
 First Nurse Note;  Pt via [facility] from home. Pt c/o NVD started last night, states blood is in it. Pt is A&Ox4 and NAD 150/96 BP  107 HR  96% on RA

## 2023-05-10 NOTE — ED Provider Notes (Signed)
 [facility] Provider Note    Event Date/Time   First MD Initiated Contact with Patient 05/10/23 1346     (approximate)  History   Chief Complaint: Abdominal Pain  HPI  Chad Brady is a 48 y.o. male with a past medical history of hypertension, hyperlipidemia, alcohol use, presents to the emergency department for nausea vomiting diarrhea.  According to the patient his girlfriend recently had "a stomach bug."  Patient states overnight last night he developed nausea vomiting and diarrhea.  States he has had multiple episodes of vomiting as well as diarrhea.  Patient noted some blood in the vomit this morning he was concerned so he came to the emergency department for evaluation.  Physical Exam   Triage Vital Signs: ED Triage Vitals  Encounter Vitals Group     BP 05/10/23 1332 135/86     Systolic BP Percentile --      Diastolic BP Percentile --      Pulse Rate 05/10/23 1332 70     Resp 05/10/23 1332 17     Temp 05/10/23 1332 98 F (36.7 C)     Temp src --      SpO2 05/10/23 1332 95 %     Weight 05/10/23 1327 197 lb (89.4 kg)     Height 05/10/23 1327 6' (1.829 m)     Head Circumference --      Peak Flow --      Pain Score 05/10/23 1327 7     Pain Loc --      Pain Education --      Exclude from Growth Chart --     Most recent vital signs: Vitals:   05/10/23 1332  BP: 135/86  Pulse: 70  Resp: 17  Temp: 98 F (36.7 C)  SpO2: 95%    General: Awake, no distress.  CV:  Good peripheral perfusion.  Regular rate and rhythm  Resp:  Normal effort.  Equal breath sounds bilaterally.  Abd:  No distention.  Soft, nontender.  No rebound or guarding.  Benign abdomen.  ED Results / Procedures / Treatments    MEDICATIONS ORDERED IN ED: Medications  sodium chloride 0.9 % bolus 1,000 mL (1,000 mLs Intravenous New Bag/Given 05/10/23 1422)  ondansetron (ZOFRAN) injection 4 mg (4 mg Intravenous Given 05/10/23 1423)  pantoprazole (PROTONIX) injection 80 mg (80  mg Intravenous Given 05/10/23 1423)     IMPRESSION / MDM / ASSESSMENT AND PLAN / ED COURSE  I reviewed the triage vital signs and the nursing notes.  Patient's presentation is most consistent with acute presentation with potential threat to life or bodily function.  Patient presents emergency department for nausea and vomiting started overnight last night as well as several episodes of diarrhea.  Patient states his girlfriend recently had a "stomach bug" and had nausea vomiting and diarrhea as well.  Patient does admit to significant alcohol use is not drinking any today and very little yesterday.  Patient states he noted some blood in his vomit this morning which was concerning so he came to the emergency department for evaluation.  Patient describes vomiting multiple times before he saw any blood.  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  Patient's lab work however has resulted showing a reassuring CBC, however chemistry shows significant anion gap elevation to 28 along with acute kidney injury and a bicarb of 15.  Concerning for possible alcoholic ketoacidosis versus diabetic ketoacidosis.  Blood sugars slightly elevated 317.Marland Kitchen  Patient's VBG shows  a pH of 7.29.  Patient strongly wishes to go home.  States he has not vomited since this morning and feels like he can drink an adequate amount of fluids at home.  Patient is agreeable to allow me to repeat the chemistry after he received 2 L of fluid.  Patient's repeat chemistry still shows an elevated anion gap of 18 however significantly decreased from 28 creatinine is downtrending currently 1.9.  Bicarb is trending up currently 19.  Blood sugar down to 207.  I spoke to the patient if he wants to go home I believe this would be okay but the patient needs to continue to significantly hydrate with nonalcoholic and nonsugar  fluids at home.  Patient's urinalysis does show a very slight amount of ketones otherwise reassuring.  No history of  diabetes.  However discussed with the patient he needs to follow-up with his doctor for further testing.  Patient agreeable to plan of care.  FINAL CLINICAL IMPRESSION(S) / ED DIAGNOSES   Alcoholic ketoacidosis   Note:  This document was prepared using Dragon voice recognition software and may include unintentional dictation errors.   Minna Antis, MD 05/10/23 1901

## 2023-05-10 NOTE — Discharge Instructions (Signed)
 As we discussed your lab work today shows significant dehydration.  Please drink plenty of water over the next several days.  Your blood work is also concerning for an elevated glucose level possibly indicating new onset diabetes.  Please follow-up with your primary care doctor this week for further evaluation and testing.  Please begin taking your metformin 500 mg twice daily until you are able to see your doctor.  Please take Zofran to be used if needed for nausea.  Return to the emergency department for any further nausea vomiting or any other symptom concerning to yourself.

## 2023-05-11 LAB — BETA-HYDROXYBUTYRIC ACID: Beta-Hydroxybutyric Acid: 6.52 mmol/L — ABNORMAL HIGH (ref 0.05–0.27)

## 2023-06-07 ENCOUNTER — Emergency Department: Payer: MEDICAID

## 2023-06-07 ENCOUNTER — Emergency Department
Admission: EM | Admit: 2023-06-07 | Discharge: 2023-06-07 | Discharge status: Against Medical Advice | Payer: MEDICAID | Attending: Emergency Medicine | Admitting: Emergency Medicine

## 2023-06-07 ENCOUNTER — Other Ambulatory Visit: Payer: Self-pay

## 2023-06-07 DIAGNOSIS — Z5321 Procedure and treatment not carried out due to patient leaving prior to being seen by health care provider: Secondary | ICD-10-CM | POA: Diagnosis not present

## 2023-06-07 DIAGNOSIS — S0191XA Laceration without foreign body of unspecified part of head, initial encounter: Secondary | ICD-10-CM | POA: Insufficient documentation

## 2023-06-07 DIAGNOSIS — R519 Headache, unspecified: Secondary | ICD-10-CM | POA: Diagnosis not present
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
## 2023-06-07 NOTE — ED Notes (Signed)
 Patient was seen by this RN leaving the department with a steady gait. Patient A&Ox4 NAD noted. Cuthriell, PA made aware at this time.

## 2023-07-29 ENCOUNTER — Inpatient Hospital Stay: Payer: MEDICAID

## 2023-07-29 ENCOUNTER — Inpatient Hospital Stay
Admission: EM | Admit: 2023-07-29 | Discharge: 2023-07-31 | DRG: 682 | Disposition: A | Discharge status: Home / Self Care | Payer: MEDICAID | Attending: Internal Medicine | Admitting: Internal Medicine

## 2023-07-29 ENCOUNTER — Emergency Department: Payer: MEDICAID

## 2023-07-29 DIAGNOSIS — I959 Hypotension, unspecified: Secondary | ICD-10-CM | POA: Diagnosis present

## 2023-07-29 DIAGNOSIS — R531 Weakness: Secondary | ICD-10-CM | POA: Diagnosis present

## 2023-07-29 DIAGNOSIS — Z833 Family history of diabetes mellitus: Secondary | ICD-10-CM

## 2023-07-29 DIAGNOSIS — Z87828 Personal history of other (healed) physical injury and trauma: Secondary | ICD-10-CM | POA: Diagnosis not present

## 2023-07-29 DIAGNOSIS — X30XXXA Exposure to excessive natural heat, initial encounter: Secondary | ICD-10-CM | POA: Diagnosis not present

## 2023-07-29 DIAGNOSIS — Y990 Civilian activity done for income or pay: Secondary | ICD-10-CM | POA: Diagnosis not present

## 2023-07-29 DIAGNOSIS — W19XXXA Unspecified fall, initial encounter: Secondary | ICD-10-CM

## 2023-07-29 DIAGNOSIS — Z7984 Long term (current) use of oral hypoglycemic drugs: Secondary | ICD-10-CM

## 2023-07-29 DIAGNOSIS — Z87820 Personal history of traumatic brain injury: Secondary | ICD-10-CM | POA: Diagnosis not present

## 2023-07-29 DIAGNOSIS — T675XXA Heat exhaustion, unspecified, initial encounter: Secondary | ICD-10-CM | POA: Diagnosis present

## 2023-07-29 DIAGNOSIS — N189 Chronic kidney disease, unspecified: Secondary | ICD-10-CM | POA: Diagnosis not present

## 2023-07-29 DIAGNOSIS — I10 Essential (primary) hypertension: Secondary | ICD-10-CM | POA: Diagnosis present

## 2023-07-29 DIAGNOSIS — S0101XA Laceration without foreign body of scalp, initial encounter: Secondary | ICD-10-CM | POA: Diagnosis present

## 2023-07-29 DIAGNOSIS — Z8249 Family history of ischemic heart disease and other diseases of the circulatory system: Secondary | ICD-10-CM | POA: Diagnosis not present

## 2023-07-29 DIAGNOSIS — Z823 Family history of stroke: Secondary | ICD-10-CM | POA: Diagnosis not present

## 2023-07-29 DIAGNOSIS — F1010 Alcohol abuse, uncomplicated: Secondary | ICD-10-CM | POA: Diagnosis present

## 2023-07-29 DIAGNOSIS — E860 Dehydration: Secondary | ICD-10-CM | POA: Diagnosis present

## 2023-07-29 DIAGNOSIS — R6511 Systemic inflammatory response syndrome (SIRS) of non-infectious origin with acute organ dysfunction: Secondary | ICD-10-CM | POA: Diagnosis present

## 2023-07-29 DIAGNOSIS — E785 Hyperlipidemia, unspecified: Secondary | ICD-10-CM | POA: Diagnosis present

## 2023-07-29 DIAGNOSIS — G40909 Epilepsy, unspecified, not intractable, without status epilepticus: Secondary | ICD-10-CM | POA: Diagnosis present

## 2023-07-29 DIAGNOSIS — R109 Unspecified abdominal pain: Secondary | ICD-10-CM | POA: Diagnosis present

## 2023-07-29 DIAGNOSIS — Y9301 Activity, walking, marching and hiking: Secondary | ICD-10-CM | POA: Diagnosis present

## 2023-07-29 DIAGNOSIS — W101XXA Fall (on)(from) sidewalk curb, initial encounter: Secondary | ICD-10-CM | POA: Diagnosis present

## 2023-07-29 DIAGNOSIS — Z87891 Personal history of nicotine dependence: Secondary | ICD-10-CM | POA: Diagnosis not present

## 2023-07-29 DIAGNOSIS — N50819 Testicular pain, unspecified: Secondary | ICD-10-CM | POA: Diagnosis present

## 2023-07-29 DIAGNOSIS — M109 Gout, unspecified: Secondary | ICD-10-CM | POA: Diagnosis present

## 2023-07-29 DIAGNOSIS — S069XAS Unspecified intracranial injury with loss of consciousness status unknown, sequela: Secondary | ICD-10-CM

## 2023-07-29 DIAGNOSIS — N179 Acute kidney failure, unspecified: Secondary | ICD-10-CM | POA: Diagnosis present

## 2023-07-29 DIAGNOSIS — Z79899 Other long term (current) drug therapy: Secondary | ICD-10-CM | POA: Diagnosis not present

## 2023-07-29 LAB — CBC
HCT: 43.3 % (ref 39.0–52.0)
Hemoglobin: 14.8 g/dL (ref 13.0–17.0)
MCH: 34.4 pg — ABNORMAL HIGH (ref 26.0–34.0)
MCHC: 34.2 g/dL (ref 30.0–36.0)
MCV: 100.7 fL — ABNORMAL HIGH (ref 80.0–100.0)
Platelets: 260 10*3/uL (ref 150–400)
RBC: 4.3 MIL/uL (ref 4.22–5.81)
RDW: 13.2 % (ref 11.5–15.5)
WBC: 12.9 10*3/uL — ABNORMAL HIGH (ref 4.0–10.5)
nRBC: 0 % (ref 0.0–0.2)

## 2023-07-29 LAB — COMPREHENSIVE METABOLIC PANEL WITH GFR
ALT: 41 U/L (ref 0–44)
ALT: 39 U/L (ref 0–44)
AST: 84 U/L — ABNORMAL HIGH (ref 15–41)
AST: 71 U/L — ABNORMAL HIGH (ref 15–41)
Albumin: 4.6 g/dL (ref 3.5–5.0)
Albumin: 4.3 g/dL (ref 3.5–5.0)
Alkaline Phosphatase: 62 U/L (ref 38–126)
Alkaline Phosphatase: 64 U/L (ref 38–126)
Anion gap: 20 — ABNORMAL HIGH (ref 5–15)
Anion gap: 16 — ABNORMAL HIGH (ref 5–15)
BUN: 30 mg/dL — ABNORMAL HIGH (ref 6–20)
BUN: 34 mg/dL — ABNORMAL HIGH (ref 6–20)
CO2: 20 mmol/L — ABNORMAL LOW (ref 22–32)
CO2: 21 mmol/L — ABNORMAL LOW (ref 22–32)
Calcium: 9.1 mg/dL (ref 8.9–10.3)
Calcium: 8.6 mg/dL — ABNORMAL LOW (ref 8.9–10.3)
Chloride: 94 mmol/L — ABNORMAL LOW (ref 98–111)
Chloride: 96 mmol/L — ABNORMAL LOW (ref 98–111)
Creatinine, Ser: 5.83 mg/dL — ABNORMAL HIGH (ref 0.61–1.24)
Creatinine, Ser: 4.17 mg/dL — ABNORMAL HIGH (ref 0.61–1.24)
GFR, Estimated: 11 mL/min — ABNORMAL LOW (ref >60)
GFR, Estimated: 17 mL/min — ABNORMAL LOW (ref >60)
Glucose, Bld: 130 mg/dL — ABNORMAL HIGH (ref 70–99)
Glucose, Bld: 111 mg/dL — ABNORMAL HIGH (ref 70–99)
Potassium: 4 mmol/L (ref 3.5–5.1)
Potassium: 4.1 mmol/L (ref 3.5–5.1)
Sodium: 134 mmol/L — ABNORMAL LOW (ref 135–145)
Sodium: 133 mmol/L — ABNORMAL LOW (ref 135–145)
Total Bilirubin: 1.5 mg/dL — ABNORMAL HIGH (ref 0.0–1.2)
Total Bilirubin: 1.6 mg/dL — ABNORMAL HIGH (ref 0.0–1.2)
Total Protein: 8 g/dL (ref 6.5–8.1)
Total Protein: 7.9 g/dL (ref 6.5–8.1)

## 2023-07-29 LAB — LACTIC ACID, PLASMA
Lactic Acid, Venous: 2.1 mmol/L (ref 0.5–1.9)
Lactic Acid, Venous: 1.6 mmol/L (ref 0.5–1.9)

## 2023-07-29 LAB — URINALYSIS, COMPLETE (UACMP) WITH MICROSCOPIC
Bilirubin Urine: NEGATIVE
Glucose, UA: NEGATIVE mg/dL
Ketones, ur: 5 mg/dL — AB
Leukocytes,Ua: NEGATIVE
Nitrite: NEGATIVE
Protein, ur: 100 mg/dL — AB
Specific Gravity, Urine: 1.015 (ref 1.005–1.030)
pH: 5 (ref 5.0–8.0)

## 2023-07-29 LAB — CBC WITH DIFFERENTIAL/PLATELET
Abs Immature Granulocytes: 0.12 10*3/uL — ABNORMAL HIGH (ref 0.00–0.07)
Basophils Absolute: 0.1 10*3/uL (ref 0.0–0.1)
Basophils Relative: 1 %
Eosinophils Absolute: 0.1 10*3/uL (ref 0.0–0.5)
Eosinophils Relative: 1 %
HCT: 42.9 % (ref 39.0–52.0)
Hemoglobin: 14.6 g/dL (ref 13.0–17.0)
Immature Granulocytes: 1 %
Lymphocytes Relative: 19 %
Lymphs Abs: 3.2 10*3/uL (ref 0.7–4.0)
MCH: 34.2 pg — ABNORMAL HIGH (ref 26.0–34.0)
MCHC: 34 g/dL (ref 30.0–36.0)
MCV: 100.5 fL — ABNORMAL HIGH (ref 80.0–100.0)
Monocytes Absolute: 1.5 10*3/uL — ABNORMAL HIGH (ref 0.1–1.0)
Monocytes Relative: 9 %
Neutro Abs: 11.5 10*3/uL — ABNORMAL HIGH (ref 1.7–7.7)
Neutrophils Relative %: 69 %
Platelets: 326 10*3/uL (ref 150–400)
RBC: 4.27 MIL/uL (ref 4.22–5.81)
RDW: 13.2 % (ref 11.5–15.5)
WBC: 16.5 10*3/uL — ABNORMAL HIGH (ref 4.0–10.5)
nRBC: 0 % (ref 0.0–0.2)

## 2023-07-29 LAB — CK: Total CK: 242 U/L (ref 49–397)

## 2023-07-29 LAB — BRAIN NATRIURETIC PEPTIDE: B Natriuretic Peptide: 24.1 pg/mL (ref 0.0–100.0)

## 2023-07-29 LAB — BLOOD GAS, VENOUS: Patient temperature: 37

## 2023-07-29 LAB — ETHANOL: Alcohol, Ethyl (B): <15 mg/dL (ref <15)

## 2023-07-29 LAB — MAGNESIUM: Magnesium: 1.8 mg/dL (ref 1.7–2.4)

## 2023-07-29 LAB — PROCALCITONIN: Procalcitonin: 0.63 ng/mL

## 2023-07-29 LAB — C-REACTIVE PROTEIN: CRP: 2.6 mg/dL — ABNORMAL HIGH (ref <1.0)

## 2023-07-29 MED ORDER — ALBUMIN HUMAN 25 % IV SOLN
12.5000 g | Freq: Once | INTRAVENOUS | Status: AC
  Administered 2023-07-29: 12.5 g via INTRAVENOUS
  Filled 2023-07-29: qty 50

## 2023-07-29 MED ORDER — THIAMINE HCL 100 MG/ML IJ SOLN: 100.0000 mg | Freq: Every day | INTRAMUSCULAR | Status: DC

## 2023-07-29 MED ORDER — ACETAMINOPHEN 650 MG RE SUPP: 650.0000 mg | Freq: Four times a day (QID) | RECTAL | Status: DC | PRN

## 2023-07-29 MED ORDER — ONDANSETRON HCL 4 MG/2ML IJ SOLN: 4.0000 mg | Freq: Four times a day (QID) | INTRAMUSCULAR | Status: DC | PRN

## 2023-07-29 MED ORDER — SODIUM CHLORIDE 0.9 % IV BOLUS
1000.0000 mL | Freq: Once | INTRAVENOUS | Status: AC
  Administered 2023-07-29: 1000 mL via INTRAVENOUS

## 2023-07-29 MED ORDER — LIDOCAINE-EPINEPHRINE-TETRACAINE (LET) TOPICAL GEL
3.0000 mL | Freq: Once | TOPICAL | Status: AC
  Administered 2023-07-29: 3 mL via TOPICAL
  Filled 2023-07-29: qty 3

## 2023-07-29 MED ORDER — LORAZEPAM 1 MG PO TABS: 1.0000 mg | ORAL_TABLET | ORAL | Status: DC | PRN

## 2023-07-29 MED ORDER — ACETAMINOPHEN 325 MG PO TABS: 650.0000 mg | ORAL_TABLET | Freq: Four times a day (QID) | ORAL | Status: DC | PRN

## 2023-07-29 MED ORDER — THIAMINE MONONITRATE 100 MG PO TABS
100.0000 mg | ORAL_TABLET | Freq: Every day | ORAL | Status: DC
  Administered 2023-07-29 – 2023-07-31 (×3): 100 mg via ORAL
  Filled 2023-07-29 (×3): qty 1

## 2023-07-29 MED ORDER — LACTATED RINGERS IV BOLUS
1000.0000 mL | Freq: Once | INTRAVENOUS | Status: AC
  Administered 2023-07-29: 1000 mL via INTRAVENOUS

## 2023-07-29 MED ORDER — ONDANSETRON HCL 4 MG PO TABS: 4.0000 mg | ORAL_TABLET | Freq: Four times a day (QID) | ORAL | Status: DC | PRN

## 2023-07-29 MED ORDER — SODIUM CHLORIDE 0.9 % IV SOLN: INTRAVENOUS | Status: DC

## 2023-07-29 MED ORDER — LORAZEPAM 2 MG/ML IJ SOLN: 1.0000 mg | INTRAMUSCULAR | Status: DC | PRN

## 2023-07-29 MED ORDER — MELATONIN 5 MG PO TABS
5.0000 mg | ORAL_TABLET | Freq: Every evening | ORAL | Status: DC | PRN
  Administered 2023-07-29 – 2023-07-30 (×2): 5 mg via ORAL
  Filled 2023-07-29 (×2): qty 1

## 2023-07-29 MED ORDER — FOLIC ACID 1 MG PO TABS
1.0000 mg | ORAL_TABLET | Freq: Every day | ORAL | Status: DC
  Administered 2023-07-29 – 2023-07-31 (×3): 1 mg via ORAL
  Filled 2023-07-29 (×3): qty 1

## 2023-07-29 MED ORDER — ADULT MULTIVITAMIN W/MINERALS CH
1.0000 | ORAL_TABLET | Freq: Every day | ORAL | Status: DC
  Administered 2023-07-29 – 2023-07-31 (×3): 1 via ORAL
  Filled 2023-07-29 (×3): qty 1

## 2023-07-29 MED ORDER — ALBUTEROL SULFATE (2.5 MG/3ML) 0.083% IN NEBU: 2.5000 mg | INHALATION_SOLUTION | RESPIRATORY_TRACT | Status: DC | PRN

## 2023-07-29 MED ORDER — HEPARIN SODIUM (PORCINE) 5000 UNIT/ML IJ SOLN
5000.0000 [IU] | Freq: Three times a day (TID) | INTRAMUSCULAR | Status: DC
  Administered 2023-07-29 – 2023-07-31 (×5): 5000 [IU] via SUBCUTANEOUS
  Filled 2023-07-29 (×5): qty 1

## 2023-07-29 NOTE — ED Notes (Signed)
 MD made aware of BP 88/55 at this time.
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
## 2023-07-29 NOTE — ED Provider Notes (Signed)
 [facility] Provider Note   Event Date/Time   First MD Initiated Contact with Patient 07/29/23 1157     (approximate) History  Fall  HPI Chad Brady is a 48 y.o. male with a past medical history of hypertension, hyperlipidemia, previous TBI, and alcohol use disorder stating he has not drank in the last 5/6 weeks.  Patient presents via POV from work after a mechanical fall.  Patient states that he was walking when he tripped on the curb and fell onto the right side including hitting the posterior scalp suffering a laceration.  Patient states that he has been feeling poorly all morning including lack of energy and mild fatigue.  Patient's blood pressure 71/48 at time of triage ROS: Patient currently denies any vision changes, tinnitus, difficulty speaking, facial droop, sore throat, chest pain, shortness of breath, abdominal pain, nausea/vomiting/diarrhea, dysuria, or weakness/numbness/paresthesias in any extremity   Physical Exam  Triage Vital Signs: ED Triage Vitals  Encounter Vitals Group     BP 07/29/23 1152 (!) 71/48     Girls Systolic BP Percentile --      Girls Diastolic BP Percentile --      Boys Systolic BP Percentile --      Boys Diastolic BP Percentile --      Pulse Rate 07/29/23 1147 100     Resp 07/29/23 1147 18     Temp 07/29/23 1152 98.4 F (36.9 C)     Temp Source 07/29/23 1152 Oral     SpO2 07/29/23 1147 99 %     Weight 07/29/23 1148 190 lb (86.2 kg)     Height 07/29/23 1148 6' (1.829 m)     Head Circumference --      Peak Flow --      Pain Score 07/29/23 1148 0     Pain Loc --      Pain Education --      Exclude from Growth Chart --    Most recent vital signs: Vitals:   07/29/23 1333 07/29/23 1430  BP: 125/88 130/85  Pulse: 90 76  Resp: 12 15  Temp:    SpO2: 95% 98%   General: Awake, oriented x4. CV:  Good peripheral perfusion. Resp:  Normal effort. Abd:  No distention. Other:  Middle-aged overweight Caucasian male resting  comfortably in no acute distress.  2 separate lacerations to the right posterior scalp including a 2-1/2 cm and 2 cm horizontal linear lacerations that are hemostatic at this time ED Results / Procedures / Treatments  Labs (all labs ordered are listed, but only abnormal results are displayed) Labs Reviewed  CBC WITH DIFFERENTIAL/PLATELET - Abnormal; Notable for the following components:      Result Value   WBC 16.5 (*)    MCV 100.5 (*)    MCH 34.2 (*)    Neutro Abs 11.5 (*)    Monocytes Absolute 1.5 (*)    Abs Immature Granulocytes 0.12 (*)    All other components within normal limits  COMPREHENSIVE METABOLIC PANEL WITH GFR - Abnormal; Notable for the following components:   Sodium 134 (*)    Chloride 94 (*)    CO2 20 (*)    Glucose, Bld 130 (*)    BUN 30 (*)    Creatinine, Ser 5.83 (*)    AST 84 (*)    Total Bilirubin 1.5 (*)    GFR, Estimated 11 (*)    Anion gap 20 (*)    All other components within normal  limits  ETHANOL  CK  CK   EKG ED ECG REPORT I, Artist MARLA Kerns, the attending physician, personally viewed and interpreted this ECG. Date: 07/29/2023 EKG Time: 1154 Rate: 99 Rhythm: normal sinus rhythm QRS Axis: normal Intervals: normal ST/T Wave abnormalities: normal Narrative Interpretation: no evidence of acute ischemia RADIOLOGY ED MD interpretation: CT of the head without contrast interpreted by me shows no evidence of acute abnormalities including no intracerebral hemorrhage, obvious masses, or significant edema - All radiology independently interpreted and agree with radiology assessment Official radiology report(s): CT Head Wo Contrast Result Date: 07/29/2023 CLINICAL DATA:  Status post fall. EXAM: CT HEAD WITHOUT CONTRAST TECHNIQUE: Contiguous axial images were obtained from the base of the skull through the vertex without intravenous contrast. RADIATION DOSE REDUCTION: This exam was performed according to the departmental dose-optimization program which  includes automated exposure control, adjustment of the mA and/or kV according to patient size and/or use of iterative reconstruction technique. COMPARISON:  Jun 07, 2023 FINDINGS: Brain: There is generalized cerebral atrophy with widening of the extra-axial spaces and ventricular dilatation. There are areas of decreased attenuation within the white matter tracts of the supratentorial brain, consistent with microvascular disease changes. Vascular: No hyperdense vessel or unexpected calcification. Skull: Normal. Negative for fracture or focal lesion. Sinuses/Orbits: No acute finding. Other: None. IMPRESSION: 1. No acute intracranial abnormality. 2. Generalized cerebral atrophy and microvascular disease changes of the supratentorial brain. Electronically Signed   By: Suzen Dials M.D.   On: 07/29/2023 14:10   PROCEDURES: Critical Care performed: No .Laceration Repair  Date/Time: 07/29/2023 3:37 PM  Performed by: Kerns Artist MARLA, MD Authorized by: Kerns Artist MARLA, MD   Consent:    Consent obtained:  Verbal   Consent given by:  Patient   Risks, benefits, and alternatives were discussed: yes     Risks discussed:  Infection, pain, retained foreign body, need for additional repair, poor cosmetic result, tendon damage, vascular damage, poor wound healing and nerve damage   Alternatives discussed:  No treatment, delayed treatment, observation and referral Universal protocol:    Immediately prior to procedure, a time out was called: yes     Patient identity confirmed:  Verbally with patient Anesthesia:    Anesthesia method:  Topical application   Topical anesthetic:  LET Laceration details:    Location:  Scalp   Scalp location:  R parietal   Length (cm):  2   Depth (mm):  5 Pre-procedure details:    Preparation:  Patient was prepped and draped in usual sterile fashion Exploration:    Wound exploration: entire depth of wound visualized     Contaminated: no   Treatment:    Area cleansed with:   Povidone-iodine and saline   Amount of cleaning:  Standard   Irrigation solution:  Sterile saline   Irrigation method:  Syringe Skin repair:    Repair method:  Staples   Number of staples:  3 Approximation:    Approximation:  Close Repair type:    Repair type:  Simple Post-procedure details:    Dressing:  Antibiotic ointment and non-adherent dressing   Procedure completion:  Tolerated well, no immediate complications  MEDICATIONS ORDERED IN ED: Medications  lactated ringers  bolus 1,000 mL (0 mLs Intravenous Stopped 07/29/23 1358)  lidocaine-EPINEPHrine-tetracaine (LET) topical gel (3 mLs Topical Given 07/29/23 1312)   IMPRESSION / MDM / ASSESSMENT AND PLAN / ED COURSE  I reviewed the triage vital signs and the nursing notes.  The patient is on the cardiac monitor to evaluate for evidence of arrhythmia and/or significant heart rate changes. Patient's presentation is most consistent with acute presentation with potential threat to life or bodily function. This patient presents with generalized weakness and fatigue likely secondary to dehydration. Suspect acute kidney injury of prerenal origin. Doubt intrinsic renal dysfunction or obstructive nephropathy. Considered alternate etiologies of the patient's symptoms including infectious processes, severe metabolic derangements or electrolyte abnormalities, ischemia/ACS, heart failure, and intracranial/central processes but think these are unlikely given the history and physical exam.  Plan: labs, 1L fluid resuscitation, admit  Dispo: Admit to medicine   FINAL CLINICAL IMPRESSION(S) / ED DIAGNOSES   Final diagnoses:  Acute renal failure superimposed on chronic kidney disease, unspecified acute renal failure type, unspecified CKD stage (HCC)   Rx / DC Orders   ED Discharge Orders     None      Note:  This document was prepared using Dragon voice recognition software and may include unintentional dictation  errors.   Gian Ybarra K, MD 07/29/23 [id number]
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
## 2023-07-30 ENCOUNTER — Other Ambulatory Visit: Payer: Self-pay

## 2023-07-30 ENCOUNTER — Inpatient Hospital Stay: Payer: MEDICAID

## 2023-07-30 DIAGNOSIS — G40909 Epilepsy, unspecified, not intractable, without status epilepticus: Secondary | ICD-10-CM

## 2023-07-30 DIAGNOSIS — F1010 Alcohol abuse, uncomplicated: Secondary | ICD-10-CM

## 2023-07-30 DIAGNOSIS — I10 Essential (primary) hypertension: Secondary | ICD-10-CM

## 2023-07-30 DIAGNOSIS — Z87828 Personal history of other (healed) physical injury and trauma: Secondary | ICD-10-CM

## 2023-07-30 DIAGNOSIS — S069XAA Unspecified intracranial injury with loss of consciousness status unknown, initial encounter: Secondary | ICD-10-CM | POA: Insufficient documentation

## 2023-07-30 LAB — COMPREHENSIVE METABOLIC PANEL WITH GFR
ALT: 28 U/L (ref 0–44)
AST: 40 U/L (ref 15–41)
Albumin: 3.5 g/dL (ref 3.5–5.0)
Alkaline Phosphatase: 49 U/L (ref 38–126)
Anion gap: 10 (ref 5–15)
BUN: 37 mg/dL — ABNORMAL HIGH (ref 6–20)
CO2: 24 mmol/L (ref 22–32)
Calcium: 8 mg/dL — ABNORMAL LOW (ref 8.9–10.3)
Chloride: 99 mmol/L (ref 98–111)
Creatinine, Ser: 3.14 mg/dL — ABNORMAL HIGH (ref 0.61–1.24)
GFR, Estimated: 24 mL/min — ABNORMAL LOW (ref >60)
Glucose, Bld: 90 mg/dL (ref 70–99)
Potassium: 3.5 mmol/L (ref 3.5–5.1)
Sodium: 133 mmol/L — ABNORMAL LOW (ref 135–145)
Total Bilirubin: 1.3 mg/dL — ABNORMAL HIGH (ref 0.0–1.2)
Total Protein: 6.2 g/dL — ABNORMAL LOW (ref 6.5–8.1)

## 2023-07-30 LAB — CBC
HCT: 35.9 % — ABNORMAL LOW (ref 39.0–52.0)
Hemoglobin: 12.2 g/dL — ABNORMAL LOW (ref 13.0–17.0)
MCH: 34.3 pg — ABNORMAL HIGH (ref 26.0–34.0)
MCHC: 34 g/dL (ref 30.0–36.0)
MCV: 100.8 fL — ABNORMAL HIGH (ref 80.0–100.0)
Platelets: 202 10*3/uL (ref 150–400)
RBC: 3.56 MIL/uL — ABNORMAL LOW (ref 4.22–5.81)
RDW: 13 % (ref 11.5–15.5)
WBC: 8.3 10*3/uL (ref 4.0–10.5)
nRBC: 0 % (ref 0.0–0.2)

## 2023-07-30 LAB — BLOOD GAS, VENOUS
Bicarbonate: 28.1 mmol/L — ABNORMAL HIGH (ref 20.0–28.0)
Patient temperature: 37 mmol/L (ref 0.0–2.0)
pCO2, Ven: 52 mmHg (ref 44–60)
pH, Ven: 7.34 (ref 7.25–7.43)
pO2, Ven: 28.1 mmol/L — ABNORMAL HIGH (ref 32–45)

## 2023-07-30 LAB — HIV ANTIBODY (ROUTINE TESTING W REFLEX): HIV Screen 4th Generation wRfx: NONREACTIVE

## 2023-07-30 MED ORDER — BUPRENORPHINE HCL-NALOXONE HCL 8-2 MG SL SUBL
1.0000 | SUBLINGUAL_TABLET | Freq: Two times a day (BID) | SUBLINGUAL | Status: DC
  Administered 2023-07-30 – 2023-07-31 (×2): 1 via SUBLINGUAL
  Filled 2023-07-30 (×2): qty 1

## 2023-07-30 MED ORDER — LACTATED RINGERS IV SOLN: INTRAVENOUS | Status: DC

## 2023-07-30 NOTE — Assessment & Plan Note (Signed)
 Blood pressure within goal now.  Hypotensive on presentation. -Holding home lisinopril  due to AKI -Continue to monitor
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
## 2023-07-30 NOTE — Plan of Care (Signed)
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
## 2023-07-30 NOTE — Assessment & Plan Note (Signed)
 Per patient he quit drinking 3 weeks ago and denies any withdrawal at this time. -CIWA monitoring

## 2023-07-30 NOTE — Plan of Care (Signed)
  Problem: Education: Goal: Knowledge of General Education information will improve Description: Including pain rating scale, medication(s)/side effects and non-pharmacologic comfort measures Outcome: Progressing   Problem: Clinical Measurements: Goal: Will remain free from infection Outcome: Progressing Goal: Respiratory complications will improve Outcome: Progressing   

## 2023-07-30 NOTE — Assessment & Plan Note (Signed)
 Likely with heat exhaustion and poor p.o. intake.  Patient works outdoors as a Administrator. Significantly elevated creatinine on admission above 5, started improving and at 3.14 today.  It was 1.93 106-month ago and was normal 54-month ago. He was not aware of any kidney disease. - Renal ultrasound ordered-pending -Continue with IV fluid -Monitor renal function -Avoid nephrotoxins

## 2023-07-30 NOTE — TOC Initial Note (Signed)
 Transition of Care [facility]) - Progression Note    Patient Details  Name: Chad Brady MRN: [id number] Date of Birth: 01-06-1976  Transition of Care [facility]) CM/SW Contact  Tomasa JAYSON Childes, RN Phone Number: 07/30/2023, 2:35 PM  Clinical Narrative:    Spoke with patient regarding consult for substance abuse.  Patient stated his awareness  that he needs help however refused resources. He was advised to ask for RNCM should he change his mind.           Expected Discharge Plan and Services                                               Social Determinants of Health (SDOH) Interventions SDOH Screenings   Food Insecurity: No Food Insecurity (07/30/2023)  Housing: Low Risk  (07/30/2023)  Transportation Needs: No Transportation Needs (07/30/2023)  Utilities: Not At Risk (07/30/2023)  Tobacco Use: High Risk (07/29/2023)    Readmission Risk Interventions     No data to display
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
## 2023-07-30 NOTE — Progress Notes (Signed)
 PT Cancellation Note  Patient Details Name: Chad Brady MRN: [id number] DOB: [birth year]   Cancelled Treatment:    Reason Eval/Treat Not Completed: PT screened, no needs identified, will sign off. Patient ambulated independently in hallway. No assist needed. He does not have any skilled PT needs.    Mcguire Gasparyan 07/30/2023, 2:07 PM
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
## 2023-07-31 DIAGNOSIS — N189 Chronic kidney disease, unspecified: Secondary | ICD-10-CM

## 2023-07-31 DIAGNOSIS — W19XXXA Unspecified fall, initial encounter: Secondary | ICD-10-CM

## 2023-07-31 DIAGNOSIS — S0101XA Laceration without foreign body of scalp, initial encounter: Secondary | ICD-10-CM

## 2023-07-31 LAB — CBC
HCT: 34.6 % — ABNORMAL LOW (ref 39.0–52.0)
Hemoglobin: 11.8 g/dL — ABNORMAL LOW (ref 13.0–17.0)
MCH: 34.4 pg — ABNORMAL HIGH (ref 26.0–34.0)
MCHC: 34.1 g/dL (ref 30.0–36.0)
MCV: 100.9 fL — ABNORMAL HIGH (ref 80.0–100.0)
Platelets: 170 10*3/uL (ref 150–400)
RBC: 3.43 MIL/uL — ABNORMAL LOW (ref 4.22–5.81)
RDW: 12.7 % (ref 11.5–15.5)
WBC: 6 10*3/uL (ref 4.0–10.5)
nRBC: 0 % (ref 0.0–0.2)

## 2023-07-31 LAB — COMPREHENSIVE METABOLIC PANEL WITH GFR
ALT: 19 U/L (ref 0–44)
AST: 24 U/L (ref 15–41)
Albumin: 3.1 g/dL — ABNORMAL LOW (ref 3.5–5.0)
Alkaline Phosphatase: 43 U/L (ref 38–126)
Anion gap: 7 (ref 5–15)
BUN: 30 mg/dL — ABNORMAL HIGH (ref 6–20)
CO2: 23 mmol/L (ref 22–32)
Calcium: 8.4 mg/dL — ABNORMAL LOW (ref 8.9–10.3)
Chloride: 106 mmol/L (ref 98–111)
Creatinine, Ser: 1.17 mg/dL (ref 0.61–1.24)
GFR, Estimated: >60 mL/min (ref >60)
Glucose, Bld: 93 mg/dL (ref 70–99)
Potassium: 4 mmol/L (ref 3.5–5.1)
Sodium: 136 mmol/L (ref 135–145)
Total Bilirubin: 0.8 mg/dL (ref 0.0–1.2)
Total Protein: 5.6 g/dL — ABNORMAL LOW (ref 6.5–8.1)

## 2023-07-31 MED ORDER — VITAMIN B-1 100 MG PO TABS: 100.0000 mg | ORAL_TABLET | Freq: Every day | ORAL | 1 refills | Status: AC

## 2023-07-31 MED ORDER — FOLIC ACID 1 MG PO TABS: 1.0000 mg | ORAL_TABLET | Freq: Every day | ORAL | 1 refills | Status: AC

## 2023-07-31 NOTE — Plan of Care (Signed)
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
## 2023-08-10 ENCOUNTER — Other Ambulatory Visit: Payer: Self-pay

## 2023-08-10 ENCOUNTER — Encounter: Payer: Self-pay | Admitting: Emergency Medicine

## 2023-08-10 ENCOUNTER — Emergency Department
Admission: EM | Admit: 2023-08-10 | Discharge: 2023-08-10 | Disposition: A | Discharge status: Home / Self Care | Payer: MEDICAID | Attending: Emergency Medicine | Admitting: Emergency Medicine

## 2023-08-10 DIAGNOSIS — S0990XA Unspecified injury of head, initial encounter: Secondary | ICD-10-CM

## 2023-08-10 DIAGNOSIS — S0181XA Laceration without foreign body of other part of head, initial encounter: Secondary | ICD-10-CM | POA: Insufficient documentation

## 2023-08-10 DIAGNOSIS — W01198A Fall on same level from slipping, tripping and stumbling with subsequent striking against other object, initial encounter: Secondary | ICD-10-CM | POA: Insufficient documentation

## 2023-08-10 DIAGNOSIS — W19XXXA Unspecified fall, initial encounter: Secondary | ICD-10-CM

## 2023-08-10 NOTE — ED Notes (Signed)
 Pt completely assessed by EDP and set to discharge. No RN assessment performed.  Pt provided discharge instructions and prescription information. Pt was given the opportunity to ask questions and questions were answered.   Pt states he is walking home and does not have a vehicle here. Pt advised since he has consumed ETOH this AM that he is not to drive. Pt verbalized understanding.

## 2023-08-10 NOTE — ED Notes (Signed)
 First Nurse Note: Pt to ED via [facility] from the side of the road. Pt states that he drink a pint of wine this morning and he slipped and hit his head. Pt is supposed to take blood thinners but has not picked them up. Pt has not been taking his BP medication. Pt has laceration on his head and right leg. Pt is in NAD.

## 2023-08-10 NOTE — ED Triage Notes (Signed)
 Patient to ED via [facility] from the side of the road for a fall. PT reports walking to ED to get sutures removed from the back of head when he fell and hit head after slipping on rocks. Denies LOC or blood thinners. Lacerations to head and right leg. Bleeding controlled at this time. PT reports he had a pint of wine this AM.

## 2023-08-10 NOTE — ED Provider Notes (Signed)
 [facility] Provider Note   Event Date/Time   First MD Initiated Contact with Patient 08/10/23 1233     (approximate) History  Fall  HPI Chad Brady is a 48 y.o. male with a past medical history of alcohol abuse who presents complaining of a mechanical fall and a laceration to left upper forehead.  Patient is also complaining of needing to get staples removed from a posterior head laceration that he suffered a week prior to arrival after falling while he was drinking.  Patient does endorse to drinking a pint of wine this morning prior to his fall.  Patient denies any loss of consciousness.  Patient states that he was leaning on a rock when it fell over and caused him to fall.  Patient denies any fall from standing.  Patient denies any difficulty with ambulation. ROS: Patient currently denies any vision changes, tinnitus, difficulty speaking, facial droop, sore throat, chest pain, shortness of breath, abdominal pain, nausea/vomiting/diarrhea, dysuria, or weakness/numbness/paresthesias in any extremity   Physical Exam  Triage Vital Signs: ED Triage Vitals [08/10/23 1132]  Encounter Vitals Group     BP (!) 144/99     Girls Systolic BP Percentile      Girls Diastolic BP Percentile      Boys Systolic BP Percentile      Boys Diastolic BP Percentile      Pulse Rate (!) 117     Resp 17     Temp 99.2 F (37.3 C)     Temp Source Oral     SpO2 94 %     Weight 190 lb (86.2 kg)     Height 6' (1.829 m)     Head Circumference      Peak Flow      Pain Score 2     Pain Loc      Pain Education      Exclude from Growth Chart    Most recent vital signs: Vitals:   08/10/23 1132  BP: (!) 144/99  Pulse: (!) 117  Resp: 17  Temp: 99.2 F (37.3 C)  SpO2: 94%   General: Awake, oriented x4. CV:  Good peripheral perfusion. Resp:  Normal effort. Abd:  No distention. Other:  Middle-aged overweight Caucasian male resting comfortably in no acute distress.  3.5 cm  laceration to the left upper forehead ED Results / Procedures / Treatments  Labs (all labs ordered are listed, but only abnormal results are displayed) Labs Reviewed - No data to display PROCEDURES: Critical Care performed: No .Laceration Repair  Date/Time: 08/10/2023 4:08 PM  Performed by: Jossie Artist POUR, MD Authorized by: Jossie Artist POUR, MD   Consent:    Consent obtained:  Verbal   Consent given by:  Patient   Risks, benefits, and alternatives were discussed: yes     Risks discussed:  Infection, pain, retained foreign body, need for additional repair, poor cosmetic result, tendon damage, vascular damage, poor wound healing and nerve damage   Alternatives discussed:  No treatment, delayed treatment, observation and referral Universal protocol:    Immediately prior to procedure, a time out was called: yes     Patient identity confirmed:  Verbally with patient Anesthesia:    Anesthesia method:  None Laceration details:    Location:  Face   Face location:  Forehead   Length (cm):  3.5   Depth (mm):  5 Pre-procedure details:    Preparation:  Patient was prepped and draped in usual sterile fashion Exploration:  Wound exploration: entire depth of wound visualized     Contaminated: no   Treatment:    Area cleansed with:  Povidone-iodine and saline   Amount of cleaning:  Standard   Irrigation solution:  Sterile saline   Irrigation method:  Syringe Skin repair:    Repair method:  Tissue adhesive Approximation:    Approximation:  Close Repair type:    Repair type:  Simple Post-procedure details:    Dressing:  Antibiotic ointment and non-adherent dressing   Procedure completion:  Tolerated well, no immediate complications  MEDICATIONS ORDERED IN ED: Medications - No data to display IMPRESSION / MDM / ASSESSMENT AND PLAN / ED COURSE  I reviewed the triage vital signs and the nursing notes.                             The patient is on the cardiac monitor to evaluate for  evidence of arrhythmia and/or significant heart rate changes. Patient's presentation is most consistent with acute presentation with potential threat to life or bodily function. Patient had a laceration that was repaired in the ED after copious irrigation.  Please see laceration procedure note for further details.  After exploration of the wound, there was no evidence of a retained foreign body. No evidence of underlying fracture. TDAP: UTD Interventions: Defer ABX at this time given location, event time, and patient without surrounding signs of infection. Disposition: Discharge. Patient has been given strict wound return precautions and instructions to follow up with their PMD in 2 days for a wound recheck.   FINAL CLINICAL IMPRESSION(S) / ED DIAGNOSES   Final diagnoses:  Fall, initial encounter  Minor head injury, initial encounter  Forehead laceration, initial encounter   Rx / DC Orders   ED Discharge Orders     None      Note:  This document was prepared using Dragon voice recognition software and may include unintentional dictation errors.   Jossie Artist POUR, MD 08/10/23 [phone]

## 2023-11-18 ENCOUNTER — Other Ambulatory Visit: Payer: Self-pay
